# Patient Record
Sex: Female | Born: 1985 | Race: Black or African American | Hispanic: No | Marital: Married | Smoking: Never smoker
Health system: Southern US, Community
[De-identification: ages and names within clinical notes are randomized; demographics above are authoritative.]

## PROBLEM LIST (undated history)

## (undated) DIAGNOSIS — D649 Anemia, unspecified: Secondary | ICD-10-CM

## (undated) DIAGNOSIS — J45909 Unspecified asthma, uncomplicated: Secondary | ICD-10-CM

## (undated) DIAGNOSIS — R011 Cardiac murmur, unspecified: Secondary | ICD-10-CM

## (undated) HISTORY — PX: TUBAL LIGATION: SHX77

## (undated) HISTORY — PX: DILATION AND CURETTAGE OF UTERUS: SHX78

---
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
## 2006-02-27 ENCOUNTER — Emergency Department (HOSPITAL_COMMUNITY): Admission: EM | Admit: 2006-02-27 | Discharge: 2006-02-27 | Payer: Self-pay | Admitting: Emergency Medicine

## 2006-04-05 ENCOUNTER — Emergency Department (HOSPITAL_COMMUNITY): Admission: EM | Admit: 2006-04-05 | Discharge: 2006-04-05 | Payer: Self-pay | Admitting: Emergency Medicine

## 2006-04-25 ENCOUNTER — Emergency Department (HOSPITAL_COMMUNITY): Admission: EM | Admit: 2006-04-25 | Discharge: 2006-04-25 | Payer: Self-pay | Admitting: Emergency Medicine

## 2006-07-27 ENCOUNTER — Emergency Department (HOSPITAL_COMMUNITY): Admission: EM | Admit: 2006-07-27 | Discharge: 2006-07-27 | Payer: Self-pay | Admitting: Emergency Medicine

## 2006-07-29 ENCOUNTER — Emergency Department (HOSPITAL_COMMUNITY): Admission: EM | Admit: 2006-07-29 | Discharge: 2006-07-29 | Payer: Self-pay | Admitting: Emergency Medicine

## 2006-09-15 ENCOUNTER — Inpatient Hospital Stay (HOSPITAL_COMMUNITY): Admission: AD | Admit: 2006-09-15 | Discharge: 2006-09-15 | Payer: Self-pay | Admitting: Gynecology

## 2006-09-23 ENCOUNTER — Emergency Department (HOSPITAL_COMMUNITY): Admission: EM | Admit: 2006-09-23 | Discharge: 2006-09-23 | Payer: Self-pay | Admitting: Emergency Medicine

## 2006-10-19 ENCOUNTER — Inpatient Hospital Stay (HOSPITAL_COMMUNITY): Admission: AD | Admit: 2006-10-19 | Discharge: 2006-10-20 | Payer: Self-pay | Admitting: Obstetrics & Gynecology

## 2007-01-20 ENCOUNTER — Emergency Department (HOSPITAL_COMMUNITY): Admission: EM | Admit: 2007-01-20 | Discharge: 2007-01-21 | Payer: Self-pay | Admitting: Emergency Medicine

## 2007-02-17 ENCOUNTER — Ambulatory Visit (HOSPITAL_COMMUNITY): Admission: RE | Admit: 2007-02-17 | Discharge: 2007-02-17 | Payer: Self-pay | Admitting: Obstetrics

## 2007-05-28 ENCOUNTER — Emergency Department (HOSPITAL_COMMUNITY): Admission: EM | Admit: 2007-05-28 | Discharge: 2007-05-28 | Payer: Self-pay | Admitting: Emergency Medicine

## 2007-06-01 ENCOUNTER — Inpatient Hospital Stay (HOSPITAL_COMMUNITY): Admission: AD | Admit: 2007-06-01 | Discharge: 2007-06-01 | Payer: Self-pay | Admitting: Obstetrics & Gynecology

## 2007-06-13 ENCOUNTER — Inpatient Hospital Stay (HOSPITAL_COMMUNITY): Admission: AD | Admit: 2007-06-13 | Discharge: 2007-06-16 | Payer: Self-pay | Admitting: Obstetrics

## 2007-08-09 ENCOUNTER — Emergency Department (HOSPITAL_COMMUNITY): Admission: EM | Admit: 2007-08-09 | Discharge: 2007-08-10 | Payer: Self-pay | Admitting: Emergency Medicine

## 2008-01-21 ENCOUNTER — Emergency Department (HOSPITAL_COMMUNITY): Admission: EM | Admit: 2008-01-21 | Discharge: 2008-01-21 | Payer: Self-pay | Admitting: Emergency Medicine
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
## 2010-07-26 NOTE — Op Note (Signed)
Taylor Duran, Taylor Duran               ACCOUNT NO.:  [id number]   MEDICAL RECORD NO.:  [id number]          PATIENT TYPE:  MAT   LOCATION:  [facility]                          FACILITY:  [facility]   PHYSICIAN:  Phil D. Okey Dupre, M.D.     DATE OF BIRTH:  12/17/1984   DATE OF PROCEDURE:  01/10/2004  DATE OF DISCHARGE:                                 OPERATIVE REPORT   PROCEDURE:  Dilatation and evacuation of pre and postoperative diagnosis and  missed abortion.   ESTIMATED BLOOD LOSS:  50 mL.   SURGEON:  Javier Glazier. Okey Dupre, M.D.   ANESTHESIA:  MAC plus local.   PATHOLOGY:  Products of conception.   DESCRIPTION OF PROCEDURE:  Under satisfactory MAC analgesia with the patient  in dorsal lithotomy position, perineum and vagina prepped and draped in the  usual sterile manner. Bimanual pelvic examination revealed the uterus to be  5-[redacted] weeks gestation, anterior, freely moveable with normal free adnexa. A  weighted speculum was placed in the posterior fourchette of the vagina  through a marital introitus, BUS within normal limits, vagina was clean and  well rugated.  The anterior lip of a friable cervix was grasped with a  single tooth tenaculum which abruptly tore out and a double tooth tenaculum  was then used. The uterine cavity sounded to a depth of 8 cm and cervical os  dilated easily to a #10 Hegar dilator, uterine suction curette of #10 was  used to evacuate the uterine contents without any problem.  The figure-of-  eight suture was placed in the anterior base of the cervix where the  tenaculum had torn for hemostasis and the speculum removed from the vagina.  The patient transferred to the recovery room in satisfactory condition  having tolerated the procedure well.      PDR/MEDQ  D:  01/10/2004  T:  01/10/2004  Job:  [id number]

## 2010-12-03 LAB — CBC
HCT: 25.2 — ABNORMAL LOW
HCT: 25.6 — ABNORMAL LOW
Hemoglobin: 8.3 — ABNORMAL LOW
Hemoglobin: 8.6 — ABNORMAL LOW
MCHC: 32.7
MCHC: 33.5
MCV: 72.6 — ABNORMAL LOW
MCV: 74 — ABNORMAL LOW
Platelets: 209
Platelets: 221
RBC: 3.41 — ABNORMAL LOW
RBC: 3.53 — ABNORMAL LOW
RDW: 16.5 — ABNORMAL HIGH
RDW: 16.6 — ABNORMAL HIGH
WBC: 10.2
WBC: 6.9

## 2010-12-03 LAB — RPR: RPR Ser Ql: NONREACTIVE

## 2010-12-03 LAB — CCBB MATERNAL DONOR DRAW

## 2010-12-10 LAB — WET PREP, GENITAL
Trich, Wet Prep: NONE SEEN
Yeast Wet Prep HPF POC: NONE SEEN

## 2010-12-10 LAB — URINE MICROSCOPIC-ADD ON

## 2010-12-10 LAB — CBC
HCT: 31.2 — ABNORMAL LOW
Hemoglobin: 10 — ABNORMAL LOW
MCHC: 32.2
MCV: 78.1
Platelets: 235
RBC: 3.99
RDW: 17.7 — ABNORMAL HIGH
WBC: 7

## 2010-12-10 LAB — GC/CHLAMYDIA PROBE AMP, GENITAL
Chlamydia, DNA Probe: NEGATIVE
GC Probe Amp, Genital: NEGATIVE

## 2010-12-10 LAB — DIFFERENTIAL
Basophils Absolute: 0.1
Basophils Relative: 1
Eosinophils Absolute: 0.4
Eosinophils Relative: 5
Lymphocytes Relative: 28
Lymphs Abs: 2
Monocytes Absolute: 0.6
Monocytes Relative: 9
Neutro Abs: 4
Neutrophils Relative %: 57

## 2010-12-10 LAB — URINALYSIS, ROUTINE W REFLEX MICROSCOPIC
Bilirubin Urine: NEGATIVE
Glucose, UA: NEGATIVE
Ketones, ur: NEGATIVE
Nitrite: NEGATIVE
Protein, ur: NEGATIVE
Specific Gravity, Urine: 1.015
Urobilinogen, UA: 1
pH: 7

## 2010-12-10 LAB — ABO/RH: ABO/RH(D): O POS

## 2010-12-10 LAB — HCG, QUANTITATIVE, PREGNANCY: hCG, Beta Chain, Quant, S: 169103 — ABNORMAL HIGH

## 2010-12-10 LAB — PREGNANCY, URINE: Preg Test, Ur: POSITIVE

## 2010-12-23 LAB — URINALYSIS, ROUTINE W REFLEX MICROSCOPIC
Bilirubin Urine: NEGATIVE
Bilirubin Urine: NEGATIVE
Glucose, UA: NEGATIVE
Hgb urine dipstick: NEGATIVE
Ketones, ur: NEGATIVE
Nitrite: NEGATIVE
Nitrite: NEGATIVE
Specific Gravity, Urine: <1.005 — ABNORMAL LOW
Specific Gravity, Urine: 1.03
Urobilinogen, UA: 0.2
pH: 5.5
pH: 6.5

## 2010-12-23 LAB — DIFFERENTIAL
Eosinophils Absolute: 0.4
Eosinophils Relative: 8 — ABNORMAL HIGH
Lymphocytes Relative: 28
Lymphs Abs: 1.4
Monocytes Absolute: 0.5

## 2010-12-23 LAB — URINE CULTURE: Colony Count: 40000

## 2010-12-23 LAB — BASIC METABOLIC PANEL
Chloride: 108
GFR calc non Af Amer: >60
Glucose, Bld: 92
Potassium: 3.9
Sodium: 139

## 2010-12-23 LAB — POCT PREGNANCY, URINE
Operator id: 251141
Operator id: 198171
Preg Test, Ur: POSITIVE

## 2010-12-23 LAB — URINE MICROSCOPIC-ADD ON

## 2010-12-23 LAB — WET PREP, GENITAL
Clue Cells Wet Prep HPF POC: NONE SEEN
Trich, Wet Prep: NONE SEEN

## 2010-12-23 LAB — CBC
HCT: 31.5 — ABNORMAL LOW
HCT: 31.6 — ABNORMAL LOW
Hemoglobin: 10.3 — ABNORMAL LOW
MCV: 77.7 — ABNORMAL LOW
Platelets: 322
RDW: 16.5 — ABNORMAL HIGH
RDW: 16.7 — ABNORMAL HIGH
WBC: 7.4
WBC: 5

## 2010-12-23 LAB — GC/CHLAMYDIA PROBE AMP, GENITAL: Chlamydia, DNA Probe: NEGATIVE

## 2010-12-23 LAB — HCG, QUANTITATIVE, PREGNANCY: hCG, Beta Chain, Quant, S: 7354 — ABNORMAL HIGH

## 2010-12-24 LAB — URINALYSIS, ROUTINE W REFLEX MICROSCOPIC
Bilirubin Urine: NEGATIVE
Glucose, UA: NEGATIVE
Ketones, ur: NEGATIVE
pH: 6

## 2010-12-24 LAB — URINE MICROSCOPIC-ADD ON

## 2010-12-24 LAB — POCT PREGNANCY, URINE
Operator id: 23548
Preg Test, Ur: NEGATIVE

## 2011-07-17 ENCOUNTER — Emergency Department (HOSPITAL_COMMUNITY): Payer: No Typology Code available for payment source

## 2011-07-17 ENCOUNTER — Encounter (HOSPITAL_COMMUNITY): Payer: Self-pay | Admitting: Emergency Medicine

## 2011-07-17 ENCOUNTER — Emergency Department (HOSPITAL_COMMUNITY)
Admission: EM | Admit: 2011-07-17 | Discharge: 2011-07-17 | Disposition: A | Discharge status: Home / Self Care | Payer: No Typology Code available for payment source | Attending: Emergency Medicine | Admitting: Emergency Medicine

## 2011-07-17 DIAGNOSIS — J3489 Other specified disorders of nose and nasal sinuses: Secondary | ICD-10-CM | POA: Insufficient documentation

## 2011-07-17 DIAGNOSIS — M545 Low back pain, unspecified: Secondary | ICD-10-CM | POA: Insufficient documentation

## 2011-07-17 DIAGNOSIS — S335XXA Sprain of ligaments of lumbar spine, initial encounter: Secondary | ICD-10-CM | POA: Insufficient documentation

## 2011-07-17 DIAGNOSIS — R059 Cough, unspecified: Secondary | ICD-10-CM | POA: Insufficient documentation

## 2011-07-17 DIAGNOSIS — R109 Unspecified abdominal pain: Secondary | ICD-10-CM | POA: Insufficient documentation

## 2011-07-17 DIAGNOSIS — R21 Rash and other nonspecific skin eruption: Secondary | ICD-10-CM | POA: Insufficient documentation

## 2011-07-17 DIAGNOSIS — R05 Cough: Secondary | ICD-10-CM | POA: Insufficient documentation

## 2011-07-17 DIAGNOSIS — S39012A Strain of muscle, fascia and tendon of lower back, initial encounter: Secondary | ICD-10-CM

## 2011-07-17 HISTORY — DX: Cardiac murmur, unspecified: R01.1

## 2011-07-17 HISTORY — DX: Anemia, unspecified: D64.9

## 2011-07-17 LAB — URINALYSIS, ROUTINE W REFLEX MICROSCOPIC
Hgb urine dipstick: NEGATIVE
Leukocytes, UA: NEGATIVE
Nitrite: NEGATIVE
Protein, ur: NEGATIVE mg/dL
Urobilinogen, UA: 2 mg/dL — ABNORMAL HIGH (ref 0.0–1.0)

## 2011-07-17 LAB — BASIC METABOLIC PANEL
Calcium: 9.6 mg/dL (ref 8.4–10.5)
GFR calc Af Amer: >90 mL/min (ref >90)
GFR calc non Af Amer: >90 mL/min (ref >90)
Glucose, Bld: 90 mg/dL (ref 70–99)
Sodium: 136 mEq/L (ref 135–145)

## 2011-07-17 LAB — DIFFERENTIAL
Eosinophils Absolute: 0.4 10*3/uL (ref 0.0–0.7)
Eosinophils Relative: 8 % — ABNORMAL HIGH (ref 0–5)
Monocytes Absolute: 0.6 10*3/uL (ref 0.1–1.0)
Neutrophils Relative %: 39 % — ABNORMAL LOW (ref 43–77)

## 2011-07-17 LAB — CBC
MCH: 22.4 pg — ABNORMAL LOW (ref 26.0–34.0)
Platelets: 210 10*3/uL (ref 150–400)
RBC: 4.02 MIL/uL (ref 3.87–5.11)

## 2011-07-17 MED ORDER — SODIUM CHLORIDE 0.9 % IV SOLN: INTRAVENOUS | Status: DC

## 2011-07-17 MED ORDER — NAPROXEN 500 MG PO TABS: 500.0000 mg | ORAL_TABLET | Freq: Two times a day (BID) | ORAL | Status: AC

## 2011-07-17 MED ORDER — IOHEXOL 300 MG/ML  SOLN
100.0000 mL | Freq: Once | INTRAMUSCULAR | Status: AC | PRN
Start: 2011-07-17 — End: 2011-07-17
  Administered 2011-07-17: 100 mL via INTRAVENOUS

## 2011-07-17 MED ORDER — SODIUM CHLORIDE 0.9 % IV BOLUS (SEPSIS)
250.0000 mL | Freq: Once | INTRAVENOUS | Status: AC
  Administered 2011-07-17: 250 mL via INTRAVENOUS

## 2011-07-17 MED ORDER — CYCLOBENZAPRINE HCL 10 MG PO TABS: 10.0000 mg | ORAL_TABLET | Freq: Two times a day (BID) | ORAL | Status: AC | PRN

## 2011-07-17 MED ORDER — HYDROCODONE-ACETAMINOPHEN 5-325 MG PO TABS: 1.0000 | ORAL_TABLET | Freq: Four times a day (QID) | ORAL | Status: AC | PRN

## 2011-07-17 NOTE — Discharge Instructions (Signed)
CT scan of abdomen was negative also no evidence of any lumbar back problems. Take Naprosyn and Flexeril as directed take hydrocodone as needed for additional pain relief. Rest for the next 2 days. Return for any new or worse symptoms followup if back not improving in 2-4 days.
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
## 2011-12-29 ENCOUNTER — Emergency Department (HOSPITAL_COMMUNITY): Payer: Medicaid Other

## 2011-12-29 ENCOUNTER — Emergency Department (HOSPITAL_COMMUNITY)
Admission: EM | Admit: 2011-12-29 | Discharge: 2011-12-29 | Disposition: A | Discharge status: Home / Self Care | Payer: Medicaid Other | Attending: Emergency Medicine | Admitting: Emergency Medicine

## 2011-12-29 ENCOUNTER — Encounter (HOSPITAL_COMMUNITY): Payer: Self-pay | Admitting: *Deleted

## 2011-12-29 DIAGNOSIS — S8010XA Contusion of unspecified lower leg, initial encounter: Secondary | ICD-10-CM | POA: Insufficient documentation

## 2011-12-29 DIAGNOSIS — J45909 Unspecified asthma, uncomplicated: Secondary | ICD-10-CM | POA: Insufficient documentation

## 2011-12-29 DIAGNOSIS — Y939 Activity, unspecified: Secondary | ICD-10-CM | POA: Insufficient documentation

## 2011-12-29 DIAGNOSIS — Y929 Unspecified place or not applicable: Secondary | ICD-10-CM | POA: Insufficient documentation

## 2011-12-29 DIAGNOSIS — Z862 Personal history of diseases of the blood and blood-forming organs and certain disorders involving the immune mechanism: Secondary | ICD-10-CM | POA: Insufficient documentation

## 2011-12-29 DIAGNOSIS — X58XXXA Exposure to other specified factors, initial encounter: Secondary | ICD-10-CM | POA: Insufficient documentation

## 2011-12-29 DIAGNOSIS — S8000XA Contusion of unspecified knee, initial encounter: Secondary | ICD-10-CM

## 2011-12-29 HISTORY — DX: Unspecified asthma, uncomplicated: J45.909

## 2011-12-29 MED ORDER — ALBUTEROL SULFATE HFA 108 (90 BASE) MCG/ACT IN AERS: 2.0000 | INHALATION_SPRAY | RESPIRATORY_TRACT | Status: DC | PRN
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
## 2011-12-29 NOTE — ED Notes (Signed)
Patient transported to X-ray 
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
## 2011-12-29 NOTE — ED Notes (Signed)
Karen, PA in with pt 

## 2011-12-30 ENCOUNTER — Observation Stay: Payer: Self-pay | Admitting: Obstetrics and Gynecology

## 2011-12-30 LAB — URINALYSIS, COMPLETE
Glucose,UR: NEGATIVE mg/dL (ref 0–75)
Nitrite: NEGATIVE
Ph: 7 (ref 4.5–8.0)
RBC,UR: 4 /HPF (ref 0–5)
Specific Gravity: 1.005 (ref 1.003–1.030)
WBC UR: 3081 /HPF (ref 0–5)

## 2011-12-30 LAB — CBC WITH DIFFERENTIAL/PLATELET
Basophil %: 0.3 %
Eosinophil %: 0.7 %
HGB: 9.3 g/dL — ABNORMAL LOW (ref 12.0–16.0)
Lymphocyte #: 1 10*3/uL (ref 1.0–3.6)
MCH: 28.3 pg (ref 26.0–34.0)
MCV: 84 fL (ref 80–100)
Monocyte #: 0.8 x10 3/mm (ref 0.2–0.9)

## 2011-12-30 LAB — BASIC METABOLIC PANEL
BUN: 4 mg/dL — ABNORMAL LOW (ref 7–18)
Calcium, Total: 8.2 mg/dL — ABNORMAL LOW (ref 8.5–10.1)
Glucose: 76 mg/dL (ref 65–99)
Osmolality: 273 (ref 275–301)
Potassium: 3.5 mmol/L (ref 3.5–5.1)

## 2011-12-31 NOTE — ED Provider Notes (Signed)
Medical screening examination/treatment/procedure(s) were performed by non-physician practitioner and as supervising physician I was immediately available for consultation/collaboration.  John-Adam Brannan Cassedy, M.D.     John-Adam Adeleigh Barletta, MD 12/31/11 1556 

## 2013-10-26 ENCOUNTER — Encounter (HOSPITAL_COMMUNITY): Payer: Self-pay | Admitting: Emergency Medicine

## 2013-10-26 ENCOUNTER — Emergency Department (HOSPITAL_COMMUNITY)
Admission: EM | Admit: 2013-10-26 | Discharge: 2013-10-26 | Disposition: A | Discharge status: Home / Self Care | Payer: Medicaid Other | Attending: Emergency Medicine | Admitting: Emergency Medicine

## 2013-10-26 DIAGNOSIS — J45909 Unspecified asthma, uncomplicated: Secondary | ICD-10-CM | POA: Insufficient documentation

## 2013-10-26 DIAGNOSIS — R011 Cardiac murmur, unspecified: Secondary | ICD-10-CM | POA: Diagnosis not present

## 2013-10-26 DIAGNOSIS — Y9389 Activity, other specified: Secondary | ICD-10-CM | POA: Diagnosis not present

## 2013-10-26 DIAGNOSIS — Z880 Allergy status to penicillin: Secondary | ICD-10-CM | POA: Diagnosis not present

## 2013-10-26 DIAGNOSIS — Y92410 Unspecified street and highway as the place of occurrence of the external cause: Secondary | ICD-10-CM | POA: Insufficient documentation

## 2013-10-26 DIAGNOSIS — S43499A Other sprain of unspecified shoulder joint, initial encounter: Secondary | ICD-10-CM | POA: Diagnosis not present

## 2013-10-26 DIAGNOSIS — Z862 Personal history of diseases of the blood and blood-forming organs and certain disorders involving the immune mechanism: Secondary | ICD-10-CM | POA: Diagnosis not present

## 2013-10-26 DIAGNOSIS — S46819A Strain of other muscles, fascia and tendons at shoulder and upper arm level, unspecified arm, initial encounter: Secondary | ICD-10-CM | POA: Diagnosis not present

## 2013-10-26 DIAGNOSIS — S0993XA Unspecified injury of face, initial encounter: Secondary | ICD-10-CM | POA: Insufficient documentation

## 2013-10-26 DIAGNOSIS — S199XXA Unspecified injury of neck, initial encounter: Secondary | ICD-10-CM

## 2013-10-26 DIAGNOSIS — S46812A Strain of other muscles, fascia and tendons at shoulder and upper arm level, left arm, initial encounter: Secondary | ICD-10-CM

## 2013-10-26 MED ORDER — NAPROXEN 500 MG PO TABS: 500.0000 mg | ORAL_TABLET | Freq: Two times a day (BID) | ORAL | Status: DC

## 2013-10-26 MED ORDER — CYCLOBENZAPRINE HCL 10 MG PO TABS: 10.0000 mg | ORAL_TABLET | Freq: Two times a day (BID) | ORAL | Status: DC | PRN
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
## 2013-10-26 NOTE — Discharge Instructions (Signed)
naprosyn for pain. Flexeril for muscle spasms. Try heating pads and stretching. Follow up with your doctor if not improving in 3-5 days.   Cervical Sprain A cervical sprain is an injury in the neck in which the strong, fibrous tissues (ligaments) that connect your neck bones stretch or tear. Cervical sprains can range from mild to severe. Severe cervical sprains can cause the neck vertebrae to be unstable. This can lead to damage of the spinal cord and can result in serious nervous system problems. The amount of time it takes for a cervical sprain to get better depends on the cause and extent of the injury. Most cervical sprains heal in 1 to 3 weeks. CAUSES  Severe cervical sprains may be caused by:   Contact sport injuries (such as from football, rugby, wrestling, hockey, auto racing, gymnastics, diving, martial arts, or boxing).   Motor vehicle collisions.   Whiplash injuries. This is an injury from a sudden forward and backward whipping movement of the head and neck.  Falls.  Mild cervical sprains may be caused by:   Being in an awkward position, such as while cradling a telephone between your ear and shoulder.   Sitting in a chair that does not offer proper support.   Working at a poorly Marketing executivedesigned computer station.   Looking up or down for long periods of time.  SYMPTOMS   Pain, soreness, stiffness, or a burning sensation in the front, back, or sides of the neck. This discomfort may develop immediately after the injury or slowly, 24 hours or more after the injury.   Pain or tenderness directly in the middle of the back of the neck.   Shoulder or upper back pain.   Limited ability to move the neck.   Headache.   Dizziness.   Weakness, numbness, or tingling in the hands or arms.   Muscle spasms.   Difficulty swallowing or chewing.   Tenderness and swelling of the neck.  DIAGNOSIS  Most of the time your health care provider can diagnose a cervical sprain  by taking your history and doing a physical exam. Your health care provider will ask about previous neck injuries and any known neck problems, such as arthritis in the neck. X-rays may be taken to find out if there are any other problems, such as with the bones of the neck. Other tests, such as a CT scan or MRI, may also be needed.  TREATMENT  Treatment depends on the severity of the cervical sprain. Mild sprains can be treated with rest, keeping the neck in place (immobilization), and pain medicines. Severe cervical sprains are immediately immobilized. Further treatment is done to help with pain, muscle spasms, and other symptoms and may include:  Medicines, such as pain relievers, numbing medicines, or muscle relaxants.   Physical therapy. This may involve stretching exercises, strengthening exercises, and posture training. Exercises and improved posture can help stabilize the neck, strengthen muscles, and help stop symptoms from returning.  HOME CARE INSTRUCTIONS   Put ice on the injured area.   Put ice in a plastic bag.   Place a towel between your skin and the bag.   Leave the ice on for 15-20 minutes, 3-4 times a day.   If your injury was severe, you may have been given a cervical collar to wear. A cervical collar is a two-piece collar designed to keep your neck from moving while it heals.  Do not remove the collar unless instructed by your health care provider.  If you have long hair, keep it outside of the collar.  Ask your health care provider before making any adjustments to your collar. Minor adjustments may be required over time to improve comfort and reduce pressure on your chin or on the back of your head.  Ifyou are allowed to remove the collar for cleaning or bathing, follow your health care provider's instructions on how to do so safely.  Keep your collar clean by wiping it with mild soap and water and drying it completely. If the collar you have been given includes  removable pads, remove them every 1-2 days and hand wash them with soap and water. Allow them to air dry. They should be completely dry before you wear them in the collar.  If you are allowed to remove the collar for cleaning and bathing, wash and dry the skin of your neck. Check your skin for irritation or sores. If you see any, tell your health care provider.  Do not drive while wearing the collar.   Only take over-the-counter or prescription medicines for pain, discomfort, or fever as directed by your health care provider.   Keep all follow-up appointments as directed by your health care provider.   Keep all physical therapy appointments as directed by your health care provider.   Make any needed adjustments to your workstation to promote good posture.   Avoid positions and activities that make your symptoms worse.   Warm up and stretch before being active to help prevent problems.  SEEK MEDICAL CARE IF:   Your pain is not controlled with medicine.   You are unable to decrease your pain medicine over time as planned.   Your activity level is not improving as expected.  SEEK IMMEDIATE MEDICAL CARE IF:   You develop any bleeding.  You develop stomach upset.  You have signs of an allergic reaction to your medicine.   Your symptoms get worse.   You develop new, unexplained symptoms.   You have numbness, tingling, weakness, or paralysis in any part of your body.  MAKE SURE YOU:   Understand these instructions.  Will watch your condition.  Will get help right away if you are not doing well or get worse. Document Released: 12/22/2006 Document Revised: 03/01/2013 Document Reviewed: 09/01/2012 [facility] Patient Information 2015 [place], [place]. This information is not intended to replace advice given to you by your health care provider. Make sure you discuss any questions you have with your health care provider.

## 2013-10-30 NOTE — ED Provider Notes (Signed)
Medical screening examination/treatment/procedure(s) were performed by non-physician practitioner and as supervising physician I was immediately available for consultation/collaboration.   EKG Interpretation None        Candyce Churn III, MD 10/30/13 [phone]

## 2014-01-09 ENCOUNTER — Encounter (HOSPITAL_COMMUNITY): Payer: Self-pay | Admitting: Emergency Medicine

## 2014-06-13 ENCOUNTER — Ambulatory Visit: Payer: Self-pay | Admitting: Certified Nurse Midwife

## 2014-07-18 NOTE — H&P (Signed)
L&D Evaluation:  History:   HPI 29 year old G6 [id number] with EDC=05/04/2012 by a 5wk6day ultrasound presents at 28 weeks with c/o nausea/vomiting since 2200 last night. She denies diarrhea and has actually had constipation. Began feeling chills upon arrival, but denies fever. Has lower abdominal pain since 6 AM, but denies vaginal bleeding. Denies dysuria or hematuria. Has had urinary frequency since +UPT. [facility]. Prenatal course has been remarkable for threatened abortion, a UTI, and anemia. She has a history of SVD x 3.    Presents with abdominal pain, nausea/vomiting    Patient's Medical History anemia, allergies    Patient's Surgical History D&C    Medications Pre Natal Vitamins  Iron    Allergies PCN, rash and itching    Social History none    Family History Non-Contributory   ROS:   ROS see HPI   Exam:   Vital Signs afebrile. 98.3-76-18 115/68    Urine Protein urine appears turbid    General lying in bed on side with chills    Mental Status clear    Chest clear    Heart normal sinus rhythm, no murmur/gallop/rubs    Abdomen uterus NT, tenderness over suprapubic area, BS active, no upper abdominal pain    Back no CVAT    Edema no edema    FHT FHTS WNL    Ucx absent    Skin dry   Impression:   Impression IUP at 22 weeks with N/V and chills. Possible UTI. Possible AGE.   Plan:   Plan UA, IV fluids and IV antiemetics. CBC, BMP.   Electronic Signatures: Trinna BalloonGutierrez, Jaylynn Siefert L (CNM)  (Signed 22-Oct-13 17:19)  Authored: L&D Evaluation   Last Updated: 22-Oct-13 17:19 by Trinna BalloonGutierrez, Rayne Loiseau L (CNM)

## 2014-08-27 ENCOUNTER — Encounter (HOSPITAL_COMMUNITY): Payer: Self-pay | Admitting: *Deleted

## 2014-08-27 ENCOUNTER — Emergency Department (HOSPITAL_COMMUNITY)
Admission: EM | Admit: 2014-08-27 | Discharge: 2014-08-27 | Discharge status: Against Medical Advice | Payer: Medicaid Other | Attending: Emergency Medicine | Admitting: Emergency Medicine

## 2014-08-27 DIAGNOSIS — Z3202 Encounter for pregnancy test, result negative: Secondary | ICD-10-CM | POA: Insufficient documentation

## 2014-08-27 DIAGNOSIS — Z79899 Other long term (current) drug therapy: Secondary | ICD-10-CM | POA: Diagnosis not present

## 2014-08-27 DIAGNOSIS — R102 Pelvic and perineal pain: Secondary | ICD-10-CM | POA: Insufficient documentation

## 2014-08-27 DIAGNOSIS — J45909 Unspecified asthma, uncomplicated: Secondary | ICD-10-CM | POA: Insufficient documentation

## 2014-08-27 DIAGNOSIS — Z880 Allergy status to penicillin: Secondary | ICD-10-CM | POA: Diagnosis not present

## 2014-08-27 DIAGNOSIS — R011 Cardiac murmur, unspecified: Secondary | ICD-10-CM | POA: Diagnosis not present

## 2014-08-27 DIAGNOSIS — Z90710 Acquired absence of both cervix and uterus: Secondary | ICD-10-CM | POA: Insufficient documentation

## 2014-08-27 DIAGNOSIS — R1030 Lower abdominal pain, unspecified: Secondary | ICD-10-CM | POA: Diagnosis present

## 2014-08-27 DIAGNOSIS — Z862 Personal history of diseases of the blood and blood-forming organs and certain disorders involving the immune mechanism: Secondary | ICD-10-CM | POA: Insufficient documentation

## 2014-08-27 LAB — HIV ANTIBODY (ROUTINE TESTING W REFLEX): HIV SCREEN 4TH GENERATION: NONREACTIVE

## 2014-08-27 LAB — URINALYSIS, ROUTINE W REFLEX MICROSCOPIC
Bilirubin Urine: NEGATIVE
Glucose, UA: NEGATIVE mg/dL
HGB URINE DIPSTICK: NEGATIVE
KETONES UR: NEGATIVE mg/dL
LEUKOCYTES UA: NEGATIVE
NITRITE: NEGATIVE
Protein, ur: NEGATIVE mg/dL
SPECIFIC GRAVITY, URINE: 1.001 — AB (ref 1.005–1.030)
Urobilinogen, UA: 0.2 mg/dL (ref 0.0–1.0)
pH: 6 (ref 5.0–8.0)

## 2014-08-27 LAB — COMPREHENSIVE METABOLIC PANEL
ALT: 11 U/L — ABNORMAL LOW (ref 14–54)
AST: 21 U/L (ref 15–41)
Albumin: 4 g/dL (ref 3.5–5.0)
Alkaline Phosphatase: 68 U/L (ref 38–126)
Anion gap: 2 — ABNORMAL LOW (ref 5–15)
BUN: 6 mg/dL (ref 6–20)
CALCIUM: 8.6 mg/dL — AB (ref 8.9–10.3)
CHLORIDE: 109 mmol/L (ref 101–111)
CO2: 23 mmol/L (ref 22–32)
Creatinine, Ser: 0.7 mg/dL (ref 0.44–1.00)
GFR calc Af Amer: >60 mL/min (ref >60)
GFR calc non Af Amer: >60 mL/min (ref >60)
GLUCOSE: 77 mg/dL (ref 65–99)
POTASSIUM: 3.4 mmol/L — AB (ref 3.5–5.1)
Sodium: 134 mmol/L — ABNORMAL LOW (ref 135–145)
Total Bilirubin: 0.2 mg/dL — ABNORMAL LOW (ref 0.3–1.2)
Total Protein: 7.8 g/dL (ref 6.5–8.1)

## 2014-08-27 LAB — CBC WITH DIFFERENTIAL/PLATELET
BASOS ABS: 0.1 10*3/uL (ref 0.0–0.1)
Basophils Relative: 1 % (ref 0–1)
EOS ABS: 0.4 10*3/uL (ref 0.0–0.7)
Eosinophils Relative: 5 % (ref 0–5)
HEMATOCRIT: 27.9 % — AB (ref 36.0–46.0)
HEMOGLOBIN: 8 g/dL — AB (ref 12.0–15.0)
LYMPHS PCT: 32 % (ref 12–46)
Lymphs Abs: 2.3 10*3/uL (ref 0.7–4.0)
MCH: 21.2 pg — AB (ref 26.0–34.0)
MCHC: 28.7 g/dL — AB (ref 30.0–36.0)
MCV: 73.8 fL — AB (ref 78.0–100.0)
MONOS PCT: 10 % (ref 3–12)
Monocytes Absolute: 0.7 10*3/uL (ref 0.1–1.0)
NEUTROS ABS: 3.8 10*3/uL (ref 1.7–7.7)
NEUTROS PCT: 52 % (ref 43–77)
PLATELETS: 524 10*3/uL — AB (ref 150–400)
RBC: 3.78 MIL/uL — AB (ref 3.87–5.11)
RDW: 20.8 % — ABNORMAL HIGH (ref 11.5–15.5)
WBC: 7.3 10*3/uL (ref 4.0–10.5)

## 2014-08-27 LAB — RPR: RPR Ser Ql: NONREACTIVE

## 2014-08-27 LAB — WET PREP, GENITAL
Clue Cells Wet Prep HPF POC: NONE SEEN
TRICH WET PREP: NONE SEEN
Yeast Wet Prep HPF POC: NONE SEEN

## 2014-08-27 LAB — LIPASE, BLOOD: LIPASE: 20 U/L — AB (ref 22–51)

## 2014-08-27 LAB — PREGNANCY, URINE: Preg Test, Ur: NEGATIVE

## 2014-08-27 NOTE — ED Notes (Signed)
Pt reports she would like to speak with MD Littie Deeds about lab work prior to discharge. MD Littie Deeds made aware and reports he will be there shortly to speak with patient.

## 2014-08-27 NOTE — ED Notes (Signed)
Pt reports abd cramping x 2 months.  Denies n/v/d at this time. Pt reports pain is getting worse.

## 2014-08-27 NOTE — Discharge Instructions (Signed)
Abdominal Pain, Women °Abdominal (stomach, pelvic, or belly) pain can be caused by many things. It is important to tell your doctor: °· The location of the pain. °· Does it come and go or is it present all the time? °· Are there things that start the pain (eating certain foods, exercise)? °· Are there other symptoms associated with the pain (fever, nausea, vomiting, diarrhea)? °All of this is helpful to know when trying to find the cause of the pain. °CAUSES  °· Stomach: virus or bacteria infection, or ulcer. °· Intestine: appendicitis (inflamed appendix), regional ileitis (Crohn's disease), ulcerative colitis (inflamed colon), irritable bowel syndrome, diverticulitis (inflamed diverticulum of the colon), or cancer of the stomach or intestine. °· Gallbladder disease or stones in the gallbladder. °· Kidney disease, kidney stones, or infection. °· Pancreas infection or cancer. °· Fibromyalgia (pain disorder). °· Diseases of the female organs: °· Uterus: fibroid (non-cancerous) tumors or infection. °· Fallopian tubes: infection or tubal pregnancy. °· Ovary: cysts or tumors. °· Pelvic adhesions (scar tissue). °· Endometriosis (uterus lining tissue growing in the pelvis and on the pelvic organs). °· Pelvic congestion syndrome (female organs filling up with blood just before the menstrual period). °· Pain with the menstrual period. °· Pain with ovulation (producing an egg). °· Pain with an IUD (intrauterine device, birth control) in the uterus. °· Cancer of the female organs. °· Functional pain (pain not caused by a disease, may improve without treatment). °· Psychological pain. °· Depression. °DIAGNOSIS  °Your doctor will decide the seriousness of your pain by doing an examination. °· Blood tests. °· X-rays. °· Ultrasound. °· CT scan (computed tomography, special type of X-ray). °· MRI (magnetic resonance imaging). °· Cultures, for infection. °· Barium enema (dye inserted in the large intestine, to better view it with  X-rays). °· Colonoscopy (looking in intestine with a lighted tube). °· Laparoscopy (minor surgery, looking in abdomen with a lighted tube). °· Major abdominal exploratory surgery (looking in abdomen with a large incision). °TREATMENT  °The treatment will depend on the cause of the pain.  °· Many cases can be observed and treated at home. °· Over-the-counter medicines recommended by your caregiver. °· Prescription medicine. °· Antibiotics, for infection. °· Birth control pills, for painful periods or for ovulation pain. °· Hormone treatment, for endometriosis. °· Nerve blocking injections. °· Physical therapy. °· Antidepressants. °· Counseling with a psychologist or psychiatrist. °· Minor or major surgery. °HOME CARE INSTRUCTIONS  °· Do not take laxatives, unless directed by your caregiver. °· Take over-the-counter pain medicine only if ordered by your caregiver. Do not take aspirin because it can cause an upset stomach or bleeding. °· Try a clear liquid diet (broth or water) as ordered by your caregiver. Slowly move to a bland diet, as tolerated, if the pain is related to the stomach or intestine. °· Have a thermometer and take your temperature several times a day, and record it. °· Bed rest and sleep, if it helps the pain. °· Avoid sexual intercourse, if it causes pain. °· Avoid stressful situations. °· Keep your follow-up appointments and tests, as your caregiver orders. °· If the pain does not go away with medicine or surgery, you may try: °¨ Acupuncture. °¨ Relaxation exercises (yoga, meditation). °¨ Group therapy. °¨ Counseling. °SEEK MEDICAL CARE IF:  °· You notice certain foods cause stomach pain. °· Your home care treatment is not helping your pain. °· You need stronger pain medicine. °· You want your IUD removed. °· You feel faint or   lightheaded.  You develop nausea and vomiting.  You develop a rash.  You are having side effects or an allergy to your medicine. SEEK IMMEDIATE MEDICAL CARE IF:   Your  pain does not go away or gets worse.  You have a fever.  Your pain is felt only in portions of the abdomen. The right side could possibly be appendicitis. The left lower portion of the abdomen could be colitis or diverticulitis.  You are passing blood in your stools (bright red or black tarry stools, with or without vomiting).  You have blood in your urine.  You develop chills, with or without a fever.  You pass out. MAKE SURE YOU:   Understand these instructions.  Will watch your condition.  Will get help right away if you are not doing well or get worse. Document Released: 12/22/2006 Document Revised: 07/11/2013 Document Reviewed: 01/11/2009 [facility] Patient Information 2015 [place], [place]. This information is not intended to replace advice given to you by your health care provider. Make sure you discuss any questions you have with your health care provider. Ovarian Torsion The ovaries are female reproductive organs that produce eggs. Ovarian torsion is when an ovary becomes twisted and cuts off its own blood supply. This can occur at any age. If an ovary is twisted, it cannot get blood and the ovary swells. It is a painful medical emergency. It must be treated quickly. If too much time has passed, blood flow to the ovary may not be restored and the ovary may have to be removed. CAUSES Torsion can happen in an ovary that is normal size. However, most of the time it occurs in an ovary that is enlarged. An ovary can become enlarged because of:  Harmless (benign) tumors on the ovaries.  Cancerous tumors.  Ovarian cysts, which are fluid-filled sacs.  Normal pregnancy.  A pregnancy that occurs outside the uterus (ectopic pregnancy). RISK FACTORS Risk factors are things that increase the likelihood of this condition happening. The risk factors include:  Having fallopian tubes that are longer than normal.  Having ovaries that are larger than normal.  Taking fertility medicine  to become pregnant.  Having had surgery in the pelvic area. SYMPTOMS  Sudden pain in the lower abdomen, usually on one side only.  Pelvic pain that starts after exercise.  Pelvic pain that gets worse over time.  Severe pelvic pain that comes and goes.  Pelvic pain that spreads into the lower back or thigh.  Nausea and vomiting along with pelvic pain. DIAGNOSIS Your caregiver will take a history and perform a physical exam. He or she may be able to feel an enlarged ovary. Your caregiver may order some further tests, which include:  A pregnancy test.  Imaging tests, such as pelvic Doppler ultrasonography, that measures blood flow, CT scan, or MRI. Your caregiver may also perform a diagnostic laparoscopic exam. A small surgical cut (incision) will be made in your abdomen. Then, a small lighted telescope is put through the opening. This allows your caregiver to clearly see your ovary and fallopian tube.  TREATMENT Surgery is needed when an ovary becomes twisted. It is best to do this 8 hours or less after the ovary becomes twisted. Laparoscopic ovarian torsion surgery is done to try to untwist the ovary. Sometimes, a large incision has to be made in the abdomen (laparotomy) to relieve the ovary. If the ovary cannot be untwisted, the ovary will have to be surgically removed during a procedure called salpingo-oophorectomy. Document Released:  02/13/2011 Document Revised: 07/11/2013 Document Reviewed: 02/13/2011 [employer] Patient Information 2015 [place], [place]. This information is not intended to replace advice given to you by your health care provider. Make sure you discuss any questions you have with your health care provider.

## 2014-08-27 NOTE — ED Provider Notes (Addendum)
CSN: [id number]     Arrival date & time 08/27/14  0254 History   First MD Initiated Contact with Patient 08/27/14 0310     Chief Complaint  Patient presents with  . Abdominal Pain     (Consider location/radiation/quality/duration/timing/severity/associated sxs/prior Treatment) Patient is a 29 y.o. female presenting with abdominal pain.  Abdominal Pain Pain location:  Suprapubic Pain quality: cramping and sharp   Pain radiates to:  Does not radiate Pain severity:  Moderate Onset quality:  Gradual Duration:  8 weeks Timing:  Intermittent Progression:  Waxing and waning Chronicity:  New Relieved by:  Nothing Worsened by:  Nothing tried Associated symptoms: vaginal bleeding (with periods)   Associated symptoms: no anorexia, no diarrhea, no dysuria, no fever, no nausea and no vomiting     Past Medical History  Diagnosis Date  . Heart murmur   . Anemia   . Asthma   . Pregnant    Past Surgical History  Procedure Laterality Date  . Dilation and curettage of uterus     No family history on file. History  Substance Use Topics  . Smoking status: Never Smoker   . Smokeless tobacco: Not on file  . Alcohol Use: No   OB History    Gravida Para Term Preterm AB TAB SAB Ectopic Multiple Living   1              Review of Systems  Constitutional: Negative for fever.  Gastrointestinal: Positive for abdominal pain. Negative for nausea, vomiting, diarrhea and anorexia.  Genitourinary: Positive for vaginal bleeding (with periods). Negative for dysuria.  All other systems reviewed and are negative.     Allergies  Penicillins  Home Medications   Prior to Admission medications   Medication Sig Start Date End Date Taking? Authorizing Provider  albuterol (PROVENTIL HFA;VENTOLIN HFA) 108 (90 BASE) MCG/ACT inhaler Inhale 2 puffs into the lungs every 4 (four) hours as needed for wheezing. 12/29/11  Yes Lonia Skinner Sofia, PA-C  cyclobenzaprine (FLEXERIL) 10 MG tablet Take 1 tablet (10  mg total) by mouth 2 (two) times daily as needed for muscle spasms. Patient not taking: Reported on 08/27/2014 10/26/13   Tatyana Kirichenko, PA-C   BP 99/56 mmHg  Pulse 69  Temp(Src) 97.6 F (36.4 C) (Oral)  Resp 16  SpO2 98%  LMP 08/11/2014 Physical Exam  Constitutional: She is oriented to person, place, and time. She appears well-developed and well-nourished.  HENT:  Head: Normocephalic and atraumatic.  Right Ear: External ear normal.  Left Ear: External ear normal.  Eyes: Conjunctivae and EOM are normal. Pupils are equal, round, and reactive to light.  Neck: Normal range of motion. Neck supple.  Cardiovascular: Normal rate, regular rhythm, normal heart sounds and intact distal pulses.   Pulmonary/Chest: Effort normal and breath sounds normal.  Abdominal: Soft. Bowel sounds are normal. There is no tenderness.  Genitourinary: Cervix exhibits discharge (white/yellow) and friability. Cervix exhibits no motion tenderness. Right adnexum displays no tenderness and no fullness. Left adnexum displays no tenderness and no fullness. No tenderness or bleeding in the vagina.  Musculoskeletal: Normal range of motion.  Neurological: She is alert and oriented to person, place, and time.  Skin: Skin is warm and dry.  Vitals reviewed.   ED Course  Procedures (including critical care time) Labs Review Labs Reviewed  WET PREP, GENITAL - Abnormal; Notable for the following:    WBC, Wet Prep HPF POC FEW (*)    All other components within normal limits  CBC  WITH DIFFERENTIAL/PLATELET - Abnormal; Notable for the following:    RBC 3.78 (*)    Hemoglobin 8.0 (*)    HCT 27.9 (*)    MCV 73.8 (*)    MCH 21.2 (*)    MCHC 28.7 (*)    RDW 20.8 (*)    Platelets 524 (*)    All other components within normal limits  COMPREHENSIVE METABOLIC PANEL - Abnormal; Notable for the following:    Sodium 134 (*)    Potassium 3.4 (*)    Calcium 8.6 (*)    ALT 11 (*)    Total Bilirubin 0.2 (*)    Anion gap 2  (*)    All other components within normal limits  LIPASE, BLOOD - Abnormal; Notable for the following:    Lipase 20 (*)    All other components within normal limits  URINALYSIS, ROUTINE W REFLEX MICROSCOPIC (NOT AT [facility]) - Abnormal; Notable for the following:    Specific Gravity, Urine 1.001 (*)    All other components within normal limits  PREGNANCY, URINE  HIV ANTIBODY (ROUTINE TESTING)  RPR  GC/CHLAMYDIA PROBE AMP ([place]) NOT AT [facility]    Imaging Review No results found.   EKG Interpretation None      MDM   Final diagnoses:  Pelvic pain in female    29 y.o. female without pertinent PMH presents with crampy intermittent abd pain x 2 months, worsening tonight.  No systemic symptoms.  On arrival vitals and physical exam as above.  Exam concerning for possible cervicitis, and discussed that I thought abx warranted, however pt states she has no concern for STI and refused antibiotics, despite my repeated attempts to encourage empiric treatment.  Wu as above.  Also had a detailed discussion re: possibility of adnexal pathology such as ovarian torsion and encouraged close fu if she were to develop recurrent symptoms.  She is, however, asymptomatic at this time, so I do not feel [place] warranted at this time.  Wet prep negative.  DC home with standard return precautions.  I have reviewed all laboratory and imaging studies if ordered as above  1. Pelvic pain in female         Mirian Mo, MD 08/27/14 [phone]  I was called to patient's room prior to dc.  She was very upset and insisted that she was in excruciating pain throughout all of her visit, despite the fact that during my exam she claimed to only be in minimal pain.  I informed her that this could be concerning for emergent pathology such as ovarian torsion, however the pt refused further wu and was verbally abusive towards all staff.  Given that I recommended therapy which could salvage tissue or life, consider pt dc ama, as  my official advice was to obtain US, and I had had an earlier discussion specifically addressing that this was something which could cause loss of ovarian tissue.  She refused to stay for further addressing of issues.    Mirian Mo, MD 08/27/14 2423  Mirian Mo, MD 08/27/14 [phone]

## 2014-08-27 NOTE — ED Notes (Signed)
Pt alert, and oriented, and ambulatory upon DC. Patient leaving upset about lab results.

## 2014-08-28 LAB — GC/CHLAMYDIA PROBE AMP (~~LOC~~) NOT AT ARMC
CHLAMYDIA, DNA PROBE: NEGATIVE
NEISSERIA GONORRHEA: NEGATIVE

## 2014-09-08 ENCOUNTER — Encounter (HOSPITAL_COMMUNITY): Payer: Self-pay | Admitting: *Deleted

## 2014-09-08 ENCOUNTER — Inpatient Hospital Stay (HOSPITAL_COMMUNITY)
Admission: AD | Admit: 2014-09-08 | Discharge: 2014-09-08 | Disposition: A | Discharge status: Home / Self Care | Payer: Medicaid Other | Source: Ambulatory Visit | Attending: Obstetrics & Gynecology | Admitting: Obstetrics & Gynecology

## 2014-09-08 DIAGNOSIS — Z5321 Procedure and treatment not carried out due to patient leaving prior to being seen by health care provider: Secondary | ICD-10-CM | POA: Diagnosis not present

## 2014-09-08 DIAGNOSIS — R1033 Periumbilical pain: Secondary | ICD-10-CM | POA: Insufficient documentation

## 2014-09-08 DIAGNOSIS — R109 Unspecified abdominal pain: Secondary | ICD-10-CM | POA: Diagnosis present

## 2014-09-08 LAB — URINALYSIS, ROUTINE W REFLEX MICROSCOPIC
BILIRUBIN URINE: NEGATIVE
GLUCOSE, UA: NEGATIVE mg/dL
HGB URINE DIPSTICK: NEGATIVE
Ketones, ur: NEGATIVE mg/dL
LEUKOCYTES UA: NEGATIVE
Nitrite: NEGATIVE
PROTEIN: NEGATIVE mg/dL
Urobilinogen, UA: 0.2 mg/dL (ref 0.0–1.0)
pH: 6 (ref 5.0–8.0)

## 2014-09-08 LAB — POCT PREGNANCY, URINE: PREG TEST UR: NEGATIVE

## 2014-09-08 MED ORDER — KETOROLAC TROMETHAMINE 60 MG/2ML IM SOLN: 60.0000 mg | Freq: Once | INTRAMUSCULAR | Status: DC

## 2014-09-08 NOTE — MAU Provider Note (Signed)
History     CSN: [id number]  Arrival date and time: 09/08/14 0865   First Provider Initiated Contact with Patient 09/08/14 0059      No chief complaint on file.  HPI Comments: OTHELLA SLAPPEY is a 29 y.o. [phone] who presents today with abdominal pain. She states that she was seen in the ED for this same pain on 08/27/14. However, it stopped until now. She states that the pain returned about 45 min PTA. She took tylenol, but states that it did not help with the pain. She has an appointment with her PCP on 09/14/14 for evaluation of this pain. Patient is concerned because her last period was lighter than usual, and her breasts have been itchy. She states that she had a BTL, but she is worried about possible pregnancy.   Abdominal Pain This is a new problem. The current episode started more than 1 month ago. The onset quality is gradual. The problem occurs intermittently. The problem has been unchanged. The pain is located in the periumbilical region and suprapubic region. The pain is at a severity of 7/10. The quality of the pain is cramping and burning. The abdominal pain does not radiate. Associated symptoms include nausea. Pertinent negatives include no constipation, diarrhea, dysuria, fever, frequency or vomiting. Nothing aggravates the pain. The pain is relieved by nothing. She has tried acetaminophen for the symptoms. The treatment provided no relief.     Past Medical History  Diagnosis Date  . Heart murmur   . Anemia   . Asthma   . Pregnant     Past Surgical History  Procedure Laterality Date  . Dilation and curettage of uterus      No family history on file.  History  Substance Use Topics  . Smoking status: Never Smoker   . Smokeless tobacco: Not on file  . Alcohol Use: No    Allergies:  Allergies  Allergen Reactions  . Penicillins Itching and Rash    Prescriptions prior to admission  Medication Sig Dispense Refill Last Dose  . albuterol (PROVENTIL HFA;VENTOLIN  HFA) 108 (90 BASE) MCG/ACT inhaler Inhale 2 puffs into the lungs every 4 (four) hours as needed for wheezing. 1 Inhaler 2 08/26/2014 at Unknown time  . cyclobenzaprine (FLEXERIL) 10 MG tablet Take 1 tablet (10 mg total) by mouth 2 (two) times daily as needed for muscle spasms. (Patient not taking: Reported on 08/27/2014) 20 tablet 0 Completed Course at Unknown time    Review of Systems  Constitutional: Negative for fever.  Gastrointestinal: Positive for nausea and abdominal pain. Negative for vomiting, diarrhea and constipation.  Genitourinary: Negative for dysuria, urgency and frequency.   Physical Exam   Blood pressure 121/63, pulse 80, temperature 98.3 F (36.8 C), temperature source Oral, resp. rate 18, height  (1.676 m), weight 96.616 kg (213 lb), last menstrual period 08/11/2014, SpO2 100 %, unknown if currently breastfeeding.  Physical Exam  Nursing note and vitals reviewed. Constitutional: She is oriented to person, place, and time. She appears well-developed and well-nourished. No distress.  HENT:  Head: Normocephalic.  Cardiovascular: Normal rate.   Respiratory: Effort normal.  GI: Soft. She exhibits no mass. There is no tenderness. There is no rebound and no guarding.  Neurological: She is alert and oriented to person, place, and time.  Skin: Skin is warm and dry.  Psychiatric: She has a normal mood and affect.    MAU Course  Procedures  MDM 0112: Patient is requesting an X-ray of her abdomen.  Explained that an x-ray would not give us a lot of information at this time. 0124: Patient is refusing all treatment. She has left AMA at this time.   Assessment and Plan   1. Abdominal pain, periumbilical    Patient left AMA  Tawnya CrookHogan, Chesnie Capell Donovan 09/08/2014, 1:00 AM

## 2014-09-08 NOTE — MAU Note (Signed)
PT LEFT   - I EXPLAINED   LABS  AND   RESULTS  [facility]   AND  PT 'S  C/O    ABD PAIN  AND PLAN - PT  WANTED  MORE  TEST  ORDERED.

## 2014-09-08 NOTE — MAU Note (Signed)
Pt reports lower abd pain x 1 hour, denies vaginal bleeding. Denies dysuria.

## 2014-09-08 NOTE — MAU Note (Signed)
PT  SAYS   SHE HAS  ABD  PAIN-  STARTED   IN MAY  -  WENT  TO [facility] ON 6-19  - LABS , PELVIC AND  UA ,    THEN TOLD  HER  TO COME HERE  FOR  FEMALE  PROBLEMS.    PAIN HAS  CONTINUED.   TOOK   MOTRIN   600 MG  ON Tuesday   WITH  NO RELIEF.

## 2015-03-13 ENCOUNTER — Emergency Department (HOSPITAL_COMMUNITY)
Admission: EM | Admit: 2015-03-13 | Discharge: 2015-03-13 | Disposition: A | Discharge status: Home / Self Care | Payer: Medicaid Other | Attending: Emergency Medicine | Admitting: Emergency Medicine

## 2015-03-13 ENCOUNTER — Encounter (HOSPITAL_COMMUNITY): Payer: Self-pay | Admitting: Emergency Medicine

## 2015-03-13 DIAGNOSIS — R011 Cardiac murmur, unspecified: Secondary | ICD-10-CM | POA: Diagnosis not present

## 2015-03-13 DIAGNOSIS — Z880 Allergy status to penicillin: Secondary | ICD-10-CM | POA: Diagnosis not present

## 2015-03-13 DIAGNOSIS — Z79899 Other long term (current) drug therapy: Secondary | ICD-10-CM | POA: Diagnosis not present

## 2015-03-13 DIAGNOSIS — Z862 Personal history of diseases of the blood and blood-forming organs and certain disorders involving the immune mechanism: Secondary | ICD-10-CM | POA: Insufficient documentation

## 2015-03-13 DIAGNOSIS — J45909 Unspecified asthma, uncomplicated: Secondary | ICD-10-CM | POA: Diagnosis not present

## 2015-03-13 DIAGNOSIS — R05 Cough: Secondary | ICD-10-CM | POA: Diagnosis present

## 2015-03-13 DIAGNOSIS — J019 Acute sinusitis, unspecified: Secondary | ICD-10-CM | POA: Insufficient documentation

## 2015-03-13 MED ORDER — BENZONATATE 100 MG PO CAPS: 100.0000 mg | ORAL_CAPSULE | Freq: Three times a day (TID) | ORAL | Status: DC | PRN

## 2015-03-13 MED ORDER — BUTALBITAL-APAP-CAFFEINE 50-325-40 MG PO TABS: 1.0000 | ORAL_TABLET | Freq: Three times a day (TID) | ORAL | Status: AC | PRN

## 2015-03-13 MED ORDER — SALINE SPRAY 0.65 % NA SOLN
1.0000 | NASAL | Status: DC | PRN
Start: 2015-03-13 — End: 2016-08-19

## 2015-03-13 MED ORDER — DOXYCYCLINE HYCLATE 100 MG PO CAPS: 100.0000 mg | ORAL_CAPSULE | Freq: Two times a day (BID) | ORAL | Status: DC

## 2015-03-13 MED ORDER — KETOROLAC TROMETHAMINE 60 MG/2ML IM SOLN
60.0000 mg | Freq: Once | INTRAMUSCULAR | Status: AC
  Administered 2015-03-13: 60 mg via INTRAMUSCULAR
  Filled 2015-03-13: qty 2

## 2015-03-13 NOTE — ED Provider Notes (Signed)
History  By signing my name below, I, Karle PlumberJennifer Tensley, attest that this documentation has been prepared under the direction and in the presence of TRW AutomotiveKelly Sai Zinn, PA-C. Electronically Signed: Karle PlumberJennifer Tensley, ED Scribe. 03/13/2015. 10:37 PM.  Chief Complaint  Patient presents with  . Cough   The history is provided by the patient and medical records. No language interpreter was used.    HPI Comments:  Taylor Duran is a 30 y.o. obese female who presents to the Emergency Department complaining of a cough that began about two weeks ago. She reports associated HA, sinus pressure and green rhinorrhea. She states the rhinorrhea was clear but has now appeared green for the past two days. Pt states she was hoarse but that has improved. She has taken Sudafed with some relief. She has also taken Tylenol and Ibuprofen with no significant relief of the HA. She denies any sick contacts. She denies modifying factors. She denies fever, nausea, vomiting, tinnitus, photophobia or visual changes. Pt reports allergy to PCN.  Past Medical History  Diagnosis Date  . Heart murmur   . Anemia   . Asthma   . Pregnant    Past Surgical History  Procedure Laterality Date  . Dilation and curettage of uterus     History reviewed. No pertinent family history. Social History  Substance Use Topics  . Smoking status: Never Smoker   . Smokeless tobacco: None  . Alcohol Use: No   OB History    Gravida Para Term Preterm AB TAB SAB Ectopic Multiple Living   6    2 1 1   4       Review of Systems  Constitutional: Negative for fever.  HENT: Positive for congestion and sinus pressure.   Respiratory: Positive for cough.   Gastrointestinal: Negative for nausea and vomiting.  Neurological: Positive for headaches.  All other systems reviewed and are negative.   Allergies  Penicillins  Home Medications   Prior to Admission medications   Medication Sig Start Date End Date Taking? Authorizing Provider   albuterol (PROVENTIL HFA;VENTOLIN HFA) 108 (90 BASE) MCG/ACT inhaler Inhale 2 puffs into the lungs every 4 (four) hours as needed for wheezing. 12/29/11   Elson AreasLeslie K Sofia, PA-C  cyclobenzaprine (FLEXERIL) 10 MG tablet Take 1 tablet (10 mg total) by mouth 2 (two) times daily as needed for muscle spasms. Patient not taking: Reported on 08/27/2014 10/26/13   Jaynie Crumbleatyana Kirichenko, PA-C   Triage Vitals: BP 118/67 mmHg  Pulse 58  Temp(Src) 97.5 F (36.4 C) (Oral)  Resp 16  Ht 5\' 5"  (1.651 m)  Wt 220 lb (99.791 kg)  BMI 36.61 kg/m2  SpO2 98%  LMP 03/05/2015 (Approximate)  Physical Exam  Constitutional: She is oriented to person, place, and time. She appears well-developed and well-nourished. No distress.  Nontoxic/nonseptic appearing  HENT:  Head: Normocephalic and atraumatic.  Right Ear: Tympanic membrane, external ear and ear canal normal.  Left Ear: Tympanic membrane, external ear and ear canal normal.  Nose: Mucosal edema present. No rhinorrhea. No epistaxis. Right sinus exhibits maxillary sinus tenderness. Left sinus exhibits maxillary sinus tenderness.  Mouth/Throat: Uvula is midline and oropharynx is clear and moist. No oropharyngeal exudate.  Oropharynx clear. Patient tolerating secretions without difficulty.  Eyes: Conjunctivae and EOM are normal. Pupils are equal, round, and reactive to light. No scleral icterus.  Neck: Normal range of motion.  No nuchal rigidity or meningismus  Cardiovascular: Normal rate, regular rhythm and intact distal pulses.   Pulmonary/Chest: Effort normal and  breath sounds normal. No respiratory distress. She has no wheezes. She has no rales.  No accessory muscle use. Lungs clear bilaterally. No wheezing, rales, or rhonchi.  Musculoskeletal: Normal range of motion.  Neurological: She is alert and oriented to person, place, and time. No cranial nerve deficit. She exhibits normal muscle tone. Coordination normal.  GCS 15. Speech is goal oriented. No focal  neurologic deficits appreciated. Patient ambulatory with steady gait. Grip strength 5/5 bilaterally with normal strength to resistance in all major muscle groups bilaterally. Sensation to light touch intact.  Skin: Skin is warm and dry. No rash noted. She is not diaphoretic. No erythema. No pallor.  Psychiatric: She has a normal mood and affect. Her behavior is normal.  Nursing note and vitals reviewed.   ED Course  Procedures (including critical care time) DIAGNOSTIC STUDIES: Oxygen Saturation is 98% on RA, normal by my interpretation.   COORDINATION OF CARE: 10:35 PM- Will prescribe Doxycycline, Fioricet, saline nasal spray and Tessalon. Will order Toradol injection prior to discharge. Pt verbalizes understanding and agrees to plan.  Medications - No data to display   MDM   Final diagnoses:  Acute sinusitis, recurrence not specified, unspecified location    Patient complaining of symptoms of sinusitis. Severe symptoms have been present for greater than 10 days with purulent nasal discharge and maxillary sinus pain as well as scratchy throat and cough.  Concern for acute bacterial rhinosinusitis. Patient discharged with Doxycycline. Instructions given for saline nasal wash and recommendations for follow-up with primary care physician. Return precautions given at discharge. Patient agreeable to plan with no unaddressed concerns.  I personally performed the services described in this documentation, which was scribed in my presence. The recorded information has been reviewed and is accurate.    Filed Vitals:   03/13/15 2202  BP: 118/67  Pulse: 58  Temp: 97.5 F (36.4 C)  TempSrc: Oral  Resp: 16  Height: 5\' 5"  (1.651 m)  Weight: 99.791 kg  SpO2: 98%      Antony Madura, PA-C 03/13/15 2251  Vanetta Mulders, MD 03/15/15 2005

## 2015-03-13 NOTE — ED Notes (Signed)
Pt presents to the ED with c/o sinus pressure and headache x 1 week that she reports is not responsive to sudafed.

## 2015-03-13 NOTE — ED Notes (Signed)
Pt states that she has had a cough, nasal congestion, and a headachex 1 week. Alert and oriented.

## 2015-03-13 NOTE — Discharge Instructions (Signed)
Sinusitis, Adult  Sinusitis is redness, soreness, and inflammation of the paranasal sinuses. Paranasal sinuses are air pockets within the bones of your face. They are located beneath your eyes, in the middle of your forehead, and above your eyes. In healthy paranasal sinuses, mucus is able to drain out, and air is able to circulate through them by way of your nose. However, when your paranasal sinuses are inflamed, mucus and air can become trapped. This can allow bacteria and other germs to grow and cause infection.  Sinusitis can develop quickly and last only a short time (acute) or continue over a long period (chronic). Sinusitis that lasts for more than 12 weeks is considered chronic.  CAUSES  Causes of sinusitis include:  · Allergies.  · Structural abnormalities, such as displacement of the cartilage that separates your nostrils (deviated septum), which can decrease the air flow through your nose and sinuses and affect sinus drainage.  · Functional abnormalities, such as when the small hairs (cilia) that line your sinuses and help remove mucus do not work properly or are not present.  SIGNS AND SYMPTOMS  Symptoms of acute and chronic sinusitis are the same. The primary symptoms are pain and pressure around the affected sinuses. Other symptoms include:  · Upper toothache.  · Earache.  · Headache.  · Bad breath.  · Decreased sense of smell and taste.  · A cough, which worsens when you are lying flat.  · Fatigue.  · Fever.  · Thick drainage from your nose, which often is green and may contain pus (purulent).  · Swelling and warmth over the affected sinuses.  DIAGNOSIS  Your health care provider will perform a physical exam. During your exam, your health care provider may perform any of the following to help determine if you have acute sinusitis or chronic sinusitis:  · Look in your nose for signs of abnormal growths in your nostrils (nasal polyps).  · Tap over the affected sinus to check for signs of  infection.  · View the inside of your sinuses using an imaging device that has a light attached (endoscope).  If your health care provider suspects that you have chronic sinusitis, one or more of the following tests may be recommended:  · Allergy tests.  · Nasal culture. A sample of mucus is taken from your nose, sent to a lab, and screened for bacteria.  · Nasal cytology. A sample of mucus is taken from your nose and examined by your health care provider to determine if your sinusitis is related to an allergy.  TREATMENT  Most cases of acute sinusitis are related to a viral infection and will resolve on their own within 10 days. Sometimes, medicines are prescribed to help relieve symptoms of both acute and chronic sinusitis. These may include pain medicines, decongestants, nasal steroid sprays, or saline sprays.  However, for sinusitis related to a bacterial infection, your health care provider will prescribe antibiotic medicines. These are medicines that will help kill the bacteria causing the infection.  Rarely, sinusitis is caused by a fungal infection. In these cases, your health care provider will prescribe antifungal medicine.  For some cases of chronic sinusitis, surgery is needed. Generally, these are cases in which sinusitis recurs more than 3 times per year, despite other treatments.  HOME CARE INSTRUCTIONS  · Drink plenty of water. Water helps thin the mucus so your sinuses can drain more easily.  · Use a humidifier.  · Inhale steam 3-4 times a day (for   example, sit in the bathroom with the shower running).  · Apply a warm, moist washcloth to your face 3-4 times a day, or as directed by your health care provider.  · Use saline nasal sprays to help moisten and clean your sinuses.  · Take medicines only as directed by your health care provider.  · If you were prescribed either an antibiotic or antifungal medicine, finish it all even if you start to feel better.  SEEK IMMEDIATE MEDICAL CARE IF:  · You have  increasing pain or severe headaches.  · You have nausea, vomiting, or drowsiness.  · You have swelling around your face.  · You have vision problems.  · You have a stiff neck.  · You have difficulty breathing.     This information is not intended to replace advice given to you by your health care provider. Make sure you discuss any questions you have with your health care provider.     Document Released: 02/24/2005 Document Revised: 03/17/2014 Document Reviewed: 03/11/2011  Elsevier Interactive Patient Education ©2016 [employer]      Sinus Headache  A sinus headache happens when your sinuses become clogged or swollen. You may feel pain or pressure in your face, forehead, ears, or upper teeth. Sinus headaches can be mild or severe.  HOME CARE  · Take medicines only as told by your doctor.  · If you were given an antibiotic medicine, finish all of it even if you start to feel better.  · Use a nose spray if you feel stuffed up (congested).  · If told, apply a warm, moist washcloth to your face to help lessen pain.  GET HELP IF:  · You get headaches more than one time each week.  · Light or sound bothers you.  · You have a fever.  · You feel sick to your stomach (nauseous) or you throw up (vomit).  · Your headaches do not get better with treatment.  GET HELP RIGHT AWAY IF:  · You have trouble seeing.  · You suddenly have very bad pain in your face or head.  · You start to twitch or shake (seizure).  · You are confused.  · You have a stiff neck.     This information is not intended to replace advice given to you by your health care provider. Make sure you discuss any questions you have with your health care provider.     Document Released: 06/26/2010 Document Revised: 07/11/2014 Document Reviewed: 02/20/2014  Elsevier Interactive Patient Education ©2016 [employer]

## 2015-03-14 MED ORDER — AZITHROMYCIN 250 MG PO TABS: ORAL_TABLET | ORAL | Status: DC

## 2015-03-14 NOTE — ED Provider Notes (Signed)
3:35 PM Patient called ED in regards to RX: doxycycline for sinusitis. She cannot afford this. I reviewed patient's chart. She is allergic to penicillin and cannot take Augmentin. Rx for azithromycin written.   Renne CriglerJoshua Cartha Rotert, PA-C 03/14/15 1536  Azalia BilisKevin Campos, MD 03/15/15 [phone]

## 2015-05-26 ENCOUNTER — Emergency Department (HOSPITAL_COMMUNITY): Payer: No Typology Code available for payment source

## 2015-05-26 ENCOUNTER — Encounter (HOSPITAL_COMMUNITY): Payer: Self-pay | Admitting: *Deleted

## 2015-05-26 ENCOUNTER — Emergency Department (HOSPITAL_COMMUNITY)
Admission: EM | Admit: 2015-05-26 | Discharge: 2015-05-26 | Disposition: A | Discharge status: Home / Self Care | Payer: No Typology Code available for payment source | Attending: Emergency Medicine | Admitting: Emergency Medicine

## 2015-05-26 DIAGNOSIS — R6889 Other general symptoms and signs: Secondary | ICD-10-CM

## 2015-05-26 DIAGNOSIS — J029 Acute pharyngitis, unspecified: Secondary | ICD-10-CM | POA: Insufficient documentation

## 2015-05-26 DIAGNOSIS — Z792 Long term (current) use of antibiotics: Secondary | ICD-10-CM | POA: Insufficient documentation

## 2015-05-26 DIAGNOSIS — Z7952 Long term (current) use of systemic steroids: Secondary | ICD-10-CM | POA: Insufficient documentation

## 2015-05-26 DIAGNOSIS — Z79899 Other long term (current) drug therapy: Secondary | ICD-10-CM | POA: Insufficient documentation

## 2015-05-26 DIAGNOSIS — J45909 Unspecified asthma, uncomplicated: Secondary | ICD-10-CM | POA: Insufficient documentation

## 2015-05-26 DIAGNOSIS — J309 Allergic rhinitis, unspecified: Secondary | ICD-10-CM

## 2015-05-26 DIAGNOSIS — R011 Cardiac murmur, unspecified: Secondary | ICD-10-CM | POA: Insufficient documentation

## 2015-05-26 DIAGNOSIS — M791 Myalgia: Secondary | ICD-10-CM | POA: Insufficient documentation

## 2015-05-26 DIAGNOSIS — Z862 Personal history of diseases of the blood and blood-forming organs and certain disorders involving the immune mechanism: Secondary | ICD-10-CM | POA: Insufficient documentation

## 2015-05-26 DIAGNOSIS — Z20818 Contact with and (suspected) exposure to other bacterial communicable diseases: Secondary | ICD-10-CM

## 2015-05-26 MED ORDER — CETIRIZINE HCL 10 MG PO TABS: 10.0000 mg | ORAL_TABLET | Freq: Every day | ORAL | Status: DC

## 2015-05-26 MED ORDER — IPRATROPIUM-ALBUTEROL 0.5-2.5 (3) MG/3ML IN SOLN
3.0000 mL | Freq: Once | RESPIRATORY_TRACT | Status: AC
  Administered 2015-05-26: 3 mL via RESPIRATORY_TRACT
  Filled 2015-05-26: qty 3

## 2015-05-26 MED ORDER — FLUTICASONE PROPIONATE 50 MCG/ACT NA SUSP: 2.0000 | Freq: Every day | NASAL | Status: DC

## 2015-05-26 MED ORDER — CEPHALEXIN 500 MG PO CAPS: 500.0000 mg | ORAL_CAPSULE | Freq: Two times a day (BID) | ORAL | Status: AC

## 2015-05-26 MED ORDER — CEPHALEXIN 250 MG PO CAPS
250.0000 mg | ORAL_CAPSULE | Freq: Once | ORAL | Status: AC
  Administered 2015-05-26: 250 mg via ORAL
  Filled 2015-05-26: qty 1

## 2015-05-26 NOTE — ED Notes (Signed)
Pt states "when I woke up this morning I just hurt all over, have a h/a, congested."  Pt denies n/v/d.

## 2015-05-26 NOTE — Discharge Instructions (Signed)
Strep Throat °Strep throat is a bacterial infection of the throat. Your health care provider may call the infection tonsillitis or pharyngitis, depending on whether there is swelling in the tonsils or at the back of the throat. Strep throat is most common during the cold months of the year in children who are 5-29 years of age, but it can happen during any season in people of any age. This infection is spread from person to person (contagious) through coughing, sneezing, or close contact. °CAUSES °Strep throat is caused by the bacteria called Streptococcus pyogenes. °RISK FACTORS °This condition is more likely to develop in: °· People who spend time in crowded places where the infection can spread easily. °· People who have close contact with someone who has strep throat. °SYMPTOMS °Symptoms of this condition include: °· Fever or chills.   °· Redness, swelling, or pain in the tonsils or throat. °· Pain or difficulty when swallowing. °· White or yellow spots on the tonsils or throat. °· Swollen, tender glands in the neck or under the jaw. °· Red rash all over the body (rare). °DIAGNOSIS °This condition is diagnosed by performing a rapid strep test or by taking a swab of your throat (throat culture test). Results from a rapid strep test are usually ready in a few minutes, but throat culture test results are available after one or two days. °TREATMENT °This condition is treated with antibiotic medicine. °HOME CARE INSTRUCTIONS °Medicines °· Take over-the-counter and prescription medicines only as told by your health care provider. °· Take your antibiotic as told by your health care provider. Do not stop taking the antibiotic even if you start to feel better. °· Have family members who also have a sore throat or fever tested for strep throat. They may need antibiotics if they have the strep infection. °Eating and Drinking °· Do not share food, drinking cups, or personal items that could cause the infection to spread to  other people. °· If swallowing is difficult, try eating soft foods until your sore throat feels better. °· Drink enough fluid to keep your urine clear or pale yellow. °General Instructions °· Gargle with a salt-water mixture 3-4 times per day or as needed. To make a salt-water mixture, completely dissolve ½-1 tsp of salt in 1 cup of warm water. °· Make sure that all household members wash their hands well. °· Get plenty of rest. °· Stay home from school or work until you have been taking antibiotics for 24 hours. °· Keep all follow-up visits as told by your health care provider. This is important. °SEEK MEDICAL CARE IF: °· The glands in your neck continue to get bigger. °· You develop a rash, cough, or earache. °· You cough up a thick liquid that is green, yellow-brown, or bloody. °· You have pain or discomfort that does not get better with medicine. °· Your problems seem to be getting worse rather than better. °· You have a fever. °SEEK IMMEDIATE MEDICAL CARE IF: °· You have new symptoms, such as vomiting, severe headache, stiff or painful neck, chest pain, or shortness of breath. °· You have severe throat pain, drooling, or changes in your voice. °· You have swelling of the neck, or the skin on the neck becomes red and tender. °· You have signs of dehydration, such as fatigue, dry mouth, and decreased urination. °· You become increasingly sleepy, or you cannot wake up completely. °· Your joints become red or painful. °  °This information is not intended to replace   advice given to you by your health care provider. Make sure you discuss any questions you have with your health care provider.   Document Released: 02/22/2000 Document Revised: 11/15/2014 Document Reviewed: 06/19/2014 Elsevier Interactive Patient Education 2016 [employer]   Allergic Rhinitis Allergic rhinitis is when the mucous membranes in the nose respond to allergens. Allergens are particles in the air that cause your body to have an  allergic reaction. This causes you to release allergic antibodies. Through a chain of events, these eventually cause you to release histamine into the blood stream. Although meant to protect the body, it is this release of histamine that causes your discomfort, such as frequent sneezing, congestion, and an itchy, runny nose.  CAUSES Seasonal allergic rhinitis (hay fever) is caused by pollen allergens that may come from grasses, trees, and weeds. Year-round allergic rhinitis (perennial allergic rhinitis) is caused by allergens such as house dust mites, pet dander, and mold spores. SYMPTOMS  Nasal stuffiness (congestion).  Itchy, runny nose with sneezing and tearing of the eyes. DIAGNOSIS Your health care provider can help you determine the allergen or allergens that trigger your symptoms. If you and your health care provider are unable to determine the allergen, skin or blood testing may be used. Your health care provider will diagnose your condition after taking your health history and performing a physical exam. Your health care provider may assess you for other related conditions, such as asthma, pink eye, or an ear infection. TREATMENT Allergic rhinitis does not have a cure, but it can be controlled by:  Medicines that block allergy symptoms. These may include allergy shots, nasal sprays, and oral antihistamines.  Avoiding the allergen. Hay fever may often be treated with antihistamines in pill or nasal spray forms. Antihistamines block the effects of histamine. There are over-the-counter medicines that may help with nasal congestion and swelling around the eyes. Check with your health care provider before taking or giving this medicine. If avoiding the allergen or the medicine prescribed do not work, there are many new medicines your health care provider can prescribe. Stronger medicine may be used if initial measures are ineffective. Desensitizing injections can be used if medicine and  avoidance does not work. Desensitization is when a patient is given ongoing shots until the body becomes less sensitive to the allergen. Make sure you follow up with your health care provider if problems continue. HOME CARE INSTRUCTIONS It is not possible to completely avoid allergens, but you can reduce your symptoms by taking steps to limit your exposure to them. It helps to know exactly what you are allergic to so that you can avoid your specific triggers. SEEK MEDICAL CARE IF:  You have a fever.  You develop a cough that does not stop easily (persistent).  You have shortness of breath.  You start wheezing.  Symptoms interfere with normal daily activities.   This information is not intended to replace advice given to you by your health care provider. Make sure you discuss any questions you have with your health care provider.   Document Released: 11/19/2000 Document Revised: 03/17/2014 Document Reviewed: 11/01/2012 Elsevier Interactive Patient Education 2016 [employer] Inc[employer]  Influenza Tests WHY AM I HAVING THIS TEST? You may have an influenza test to help your health care provider determine what type of respiratory infection you have. The test may also be used to help determine a treatment plan and to monitor influenza activity within a community. There are two types of influenza virus: types A and B.  Often, one strain of type A influenza will be the most common type of influenza in a community during flu season. This is typically between the months of October and May. Influenza tests can help determine which strain of influenza type A is occurring most often in the community. WHAT KIND OF SAMPLE IS TAKEN? Influenza tests are performed by collecting a small sample of fluids (secretions) from your nose or throat using a cotton swab. Tests performed on nasal secretions are more accurate than tests performed on a sample taken from your throat.  Rapid influenza tests are available and have  become the most frequently used tests for influenza. They are most accurate when completed within the first 48 hours after your symptoms begin.  Depending on the method, a rapid influenza test may be completed in your health care provider's office in less than 30 minutes. It can also be sent to a lab with the results available the same day.  Depending on the particular type of test used, it can identify influenza type A, a mixture of types A and B, or differentiate between type A and B.  Another test that your health care provider may order is a viral culture. This also requires the collection of secretions from your nose or throat. The sample is then sent to a lab for processing. This may take several days to complete. HOW ARE YOUR TEST RESULTS REPORTED? Your test results will be reported as either positive or negative. A false-negative result can occur. A false-negative result is incorrect because it indicates a condition or finding is not present when it is. It is your responsibility to obtain your test results. Ask the lab or department performing the test when and how you will get your results. WHAT DO THE RESULTS MEAN?  A positive test means you have influenza. Tests may further determine the type of influenza you have.  A negative influenza test result means it is not likely that you have influenza.  A false-negative result can occur. False-negative results are more likely to happen at the height of the influenza season. Talk with your health care provider to discuss your results, treatment options, and if necessary, the need for more tests. Talk with your health care provider if you have any questions about your results.   This information is not intended to replace advice given to you by your health care provider. Make sure you discuss any questions you have with your health care provider.   Document Released: 12/04/2004 Document Revised: 03/17/2014 Document Reviewed:  07/13/2013 Elsevier Interactive Patient Education [employer]

## 2015-05-26 NOTE — ED Provider Notes (Signed)
CSN: [id number]     Arrival date & time 05/26/15  1631 History  By signing my name below, I, Sonum Patel, attest that this documentation has been prepared under the direction and in the presence of Keyonta Barradas PA-C. Electronically Signed: Sonum Patel, Scribe. 05/26/2015. 5:37 PM.    Chief Complaint  Patient presents with  . Generalized Body Aches  . Nasal Congestion    The history is provided by the patient. No language interpreter was used.     HPI Comments: Taylor Duran is a 30 y.o. female who presents to the Emergency Department complaining of 1 day of generalized myalgias with associated rhinorrhea, congestion, subjective fever and chills, and chest tightness. She has taken ibuprofen without relief. She reports being around family members with bronchitis. She denies wheezing, abdominal pain, vomiting, cough, N, V, rash.  Past Medical History  Diagnosis Date  . Heart murmur   . Anemia   . Asthma   . Pregnant    Past Surgical History  Procedure Laterality Date  . Dilation and curettage of uterus     No family history on file. Social History  Substance Use Topics  . Smoking status: Never Smoker   . Smokeless tobacco: None  . Alcohol Use: No   OB History    Gravida Para Term Preterm AB TAB SAB Ectopic Multiple Living   Review of Systems  Constitutional: Positive for fever (subjective) and chills. Negative for activity change and appetite change.  HENT: Positive for congestion and rhinorrhea. Negative for sore throat.   Respiratory: Negative for cough, chest tightness, shortness of breath and wheezing.   Cardiovascular: Negative for chest pain, palpitations and leg swelling.  Gastrointestinal: Negative.  Negative for vomiting and abdominal pain.  Musculoskeletal: Positive for myalgias. Negative for neck pain and neck stiffness.  Skin: Negative.   Neurological: Negative.   All other systems reviewed and are negative.     Allergies   Penicillins  Home Medications   Prior to Admission medications   Medication Sig Start Date End Date Taking? Authorizing Provider  albuterol (PROVENTIL HFA;VENTOLIN HFA) 108 (90 BASE) MCG/ACT inhaler Inhale 2 puffs into the lungs every 4 (four) hours as needed for wheezing. 12/29/11   Elson Areas, PA-C  azithromycin (ZITHROMAX Z-PAK) 250 MG tablet Take 2 tabs PO on day 1, 1 tab PO on days 2-5 03/14/15   Renne Crigler, PA-C  benzonatate (TESSALON) 100 MG capsule Take 1 capsule (100 mg total) by mouth 3 (three) times daily as needed for cough. 03/13/15   Antony Madura, PA-C  butalbital-acetaminophen-caffeine (FIORICET) [phone] MG tablet Take 1-2 tablets by mouth every 8 (eight) hours as needed for headache. 03/13/15 03/12/16  Antony Madura, PA-C  cephALEXin (KEFLEX) 500 MG capsule Take 1 capsule (500 mg total) by mouth 2 (two) times daily. 05/26/15 06/05/15  Danelle Berry, PA-C  cetirizine (ZYRTEC ALLERGY) 10 MG tablet Take 1 tablet (10 mg total) by mouth daily. 05/26/15   Danelle Berry, PA-C  cyclobenzaprine (FLEXERIL) 10 MG tablet Take 1 tablet (10 mg total) by mouth 2 (two) times daily as needed for muscle spasms. Patient not taking: Reported on 08/27/2014 10/26/13   Tatyana Kirichenko, PA-C  doxycycline (VIBRAMYCIN) 100 MG capsule Take 1 capsule (100 mg total) by mouth 2 (two) times daily. 03/13/15   Antony Madura, PA-C  fluticasone (FLONASE) 50 MCG/ACT nasal spray Place 2 sprays into both nostrils daily. 05/26/15  Danelle Berry, PA-C  sodium chloride (OCEAN) 0.65 % SOLN nasal spray Place 1 spray into both nostrils as needed for congestion. 03/13/15   Antony Madura, PA-C   BP 116/60 mmHg  Pulse 93  Temp(Src) 98.3 F (36.8 C) (Oral)  Resp 16  SpO2 100%  LMP 05/09/2015 Physical Exam  Constitutional: She is oriented to person, place, and time. She appears well-developed and well-nourished. No distress.  HENT:  Head: Normocephalic and atraumatic.  Nose: Nose normal.  Mouth/Throat: Oropharynx is clear and moist.  No oropharyngeal exudate.  Eyes: Conjunctivae and EOM are normal. Pupils are equal, round, and reactive to light. Right eye exhibits no discharge. Left eye exhibits no discharge. No scleral icterus.  Edematous pale nasal mucosa  Neck: Normal range of motion. Neck supple. No JVD present. No tracheal deviation present. No thyromegaly present.  Cardiovascular: Normal rate, regular rhythm, normal heart sounds and intact distal pulses.  Exam reveals no gallop and no friction rub.   No murmur heard. Pulmonary/Chest: Effort normal and breath sounds normal. No respiratory distress. She has no wheezes. She has no rales. She exhibits tenderness.  Abdominal: Soft. Bowel sounds are normal. She exhibits no distension and no mass. There is no tenderness. There is no rebound and no guarding.  Musculoskeletal: Normal range of motion. She exhibits no edema or tenderness.  Lymphadenopathy:    She has no cervical adenopathy.  Neurological: She is alert and oriented to person, place, and time. She has normal reflexes. No cranial nerve deficit. She exhibits normal muscle tone. Coordination normal.  Skin: Skin is warm and dry. No rash noted. She is not diaphoretic. No erythema. No pallor.  Psychiatric: She has a normal mood and affect. Her behavior is normal. Judgment and thought content normal.  Nursing note and vitals reviewed.   ED Course  Procedures (including critical care time)  DIAGNOSTIC STUDIES: Oxygen Saturation is 99% on RA, normal by my interpretation.    COORDINATION OF CARE: 10:46 PM Discussed treatment plan with pt at bedside and pt agreed to plan.   Labs Review Labs Reviewed - No data to display  Imaging Review  DG Chest 2 View (Final result) Result time: 05/26/15 18:26:54   Final result by Rad Results In Interface (05/26/15 18:26:54)   Narrative:   CLINICAL DATA: Fever and chills  EXAM: CHEST 2 VIEW  COMPARISON: None.  FINDINGS: The heart size and mediastinal contours are  within normal limits. Both lungs are clear. The visualized skeletal structures are unremarkable.  IMPRESSION: No active cardiopulmonary disease.   Electronically Signed By: Alcide Clever M.D. On: 05/26/2015 18:26     I have personally reviewed and evaluated these images and lab results as part of my medical decision-making.   EKG Interpretation None      MDM   Pt presents with fever, body aches, rhinorrhea and intermittent cough.  Consistent with flu-like illness, VSS, no respiratory distress.  CXR negative.  Nasal tissue consistent with allergic rhinitis. The pt presented with her daughter who tested positive for strep throat.  Will tx pt as she may also have strep with fever/body aches.  She has penicillin allergy.  She asked for the cheapest medicine that will cover - will give Keflex.  Final diagnoses:  Flu-like symptoms  Exposure to strep throat  Allergic rhinitis, unspecified allergic rhinitis type   I personally performed the services described in this documentation, which was scribed in my presence. The recorded information has been reviewed and is accurate.  Danelle BerryLeisa Mikail Goostree, PA-C 06/04/15 0141  Tilden FossaElizabeth Rees, MD 06/06/15 [phone]

## 2016-03-05 ENCOUNTER — Encounter (HOSPITAL_COMMUNITY): Payer: Self-pay | Admitting: Emergency Medicine

## 2016-03-05 ENCOUNTER — Emergency Department (HOSPITAL_COMMUNITY)
Admission: EM | Admit: 2016-03-05 | Discharge: 2016-03-06 | Disposition: A | Discharge status: Home / Self Care | Payer: Medicaid Other | Attending: Emergency Medicine | Admitting: Emergency Medicine

## 2016-03-05 DIAGNOSIS — J45909 Unspecified asthma, uncomplicated: Secondary | ICD-10-CM | POA: Insufficient documentation

## 2016-03-05 DIAGNOSIS — R42 Dizziness and giddiness: Secondary | ICD-10-CM

## 2016-03-05 DIAGNOSIS — N390 Urinary tract infection, site not specified: Secondary | ICD-10-CM

## 2016-03-05 LAB — URINALYSIS, ROUTINE W REFLEX MICROSCOPIC
BILIRUBIN URINE: NEGATIVE
Glucose, UA: NEGATIVE mg/dL
Ketones, ur: NEGATIVE mg/dL
NITRITE: NEGATIVE
PROTEIN: NEGATIVE mg/dL
Specific Gravity, Urine: 1.005 (ref 1.005–1.030)
pH: 6 (ref 5.0–8.0)

## 2016-03-05 LAB — BASIC METABOLIC PANEL
ANION GAP: 9 (ref 5–15)
BUN: 6 mg/dL (ref 6–20)
CO2: 27 mmol/L (ref 22–32)
Calcium: 9 mg/dL (ref 8.9–10.3)
Chloride: 104 mmol/L (ref 101–111)
Creatinine, Ser: 0.62 mg/dL (ref 0.44–1.00)
GFR calc Af Amer: >60 mL/min (ref >60)
GLUCOSE: 98 mg/dL (ref 65–99)
POTASSIUM: 3.3 mmol/L — AB (ref 3.5–5.1)
SODIUM: 140 mmol/L (ref 135–145)

## 2016-03-05 LAB — CBC
HEMATOCRIT: 29.4 % — AB (ref 36.0–46.0)
HEMOGLOBIN: 8.9 g/dL — AB (ref 12.0–15.0)
MCH: 21.8 pg — ABNORMAL LOW (ref 26.0–34.0)
MCHC: 30.3 g/dL (ref 30.0–36.0)
MCV: 72.1 fL — ABNORMAL LOW (ref 78.0–100.0)
Platelets: 399 10*3/uL (ref 150–400)
RBC: 4.08 MIL/uL (ref 3.87–5.11)
RDW: 16.8 % — ABNORMAL HIGH (ref 11.5–15.5)
WBC: 7.3 10*3/uL (ref 4.0–10.5)

## 2016-03-05 LAB — CBG MONITORING, ED: Glucose-Capillary: 90 mg/dL (ref 65–99)

## 2016-03-05 LAB — POC URINE PREG, ED: Preg Test, Ur: NEGATIVE

## 2016-03-05 NOTE — ED Notes (Signed)
Pt placed on monitor.  

## 2016-03-05 NOTE — ED Triage Notes (Addendum)
Pt from home with complaints of dizziness and emesis x 5 days. Pt's last menstrual cycle was Nov 15th. Pt states she has had intermittent headaches. Pt  Denies other changes   Pt ambulates without difficulty and with a steady gait

## 2016-03-05 NOTE — ED Notes (Signed)
Pt stated "I started feeling dizzy on Saturday.  I started to stand up and got dizzy.  I vomited 1 time on Sunday.  I still feel the dizziness & lightheadedness.  I didn't know if it was my low iron."

## 2016-03-05 NOTE — ED Notes (Signed)
Pt stated "I haven't been to my doctor in a year or 2."

## 2016-03-06 MED ORDER — MECLIZINE HCL 25 MG PO TABS: 25.0000 mg | ORAL_TABLET | Freq: Three times a day (TID) | ORAL | 0 refills | Status: DC | PRN

## 2016-03-06 MED ORDER — NITROFURANTOIN MONOHYD MACRO 100 MG PO CAPS: 100.0000 mg | ORAL_CAPSULE | Freq: Two times a day (BID) | ORAL | 0 refills | Status: DC

## 2016-03-06 MED ORDER — NITROFURANTOIN MONOHYD MACRO 100 MG PO CAPS
100.0000 mg | ORAL_CAPSULE | Freq: Once | ORAL | Status: AC
  Administered 2016-03-06: 100 mg via ORAL
  Filled 2016-03-06: qty 1

## 2016-03-06 NOTE — ED Provider Notes (Signed)
[facility] Provider Note   CSN: [id number] Arrival date & time: 03/05/16  1726  By signing my name below, I, Javier Dockerobert Ryan Halas, attest that this documentation has been prepared under the direction and in the presence of Pricilla LovelessScott Coreen Shippee, MD. Electronically Signed: Javier Dockerobert Ryan Halas, ER Scribe. 10/20/2015. 12:56 AM.  History   Chief Complaint Chief Complaint  Patient presents with  . Dizziness  . Emesis   HPI  HPI Comments: Taylor Duran is a 30 y.o. female who presents to the Emergency Department complaining of five days of intermittent room spinning with one event of associated vomiting. Her sx occur with bending over or standing from sitting. She has intermittent HA, increased from baseline but her HA are not associated with her room spinning sx. She denies fever, chills, palpitations, diarrhea, flank pain, neck pain, stiffness, ear pain or ear ringing. She is also complaining of increased urine odor and urinary frequency.     Past Medical History:  Diagnosis Date  . Anemia   . Asthma   . Heart murmur   . Pregnant     There are no active problems to display for this patient.   Past Surgical History:  Procedure Laterality Date  . DILATION AND CURETTAGE OF UTERUS      OB History    Gravida Para Term Preterm AB Living   6       2 4    SAB TAB Ectopic Multiple Live Births   1 1             Home Medications    Prior to Admission medications   Medication Sig Start Date End Date Taking? Authorizing Provider  albuterol (PROVENTIL HFA;VENTOLIN HFA) 108 (90 BASE) MCG/ACT inhaler Inhale 2 puffs into the lungs every 4 (four) hours as needed for wheezing. 12/29/11   Elson AreasLeslie K Sofia, PA-C  azithromycin (ZITHROMAX Z-PAK) 250 MG tablet Take 2 tabs PO on day 1, 1 tab PO on days 2-5 03/14/15   Renne CriglerJoshua Geiple, PA-C  benzonatate (TESSALON) 100 MG capsule Take 1 capsule (100 mg total) by mouth 3 (three) times daily as needed for cough. 03/13/15   Antony MaduraKelly Humes, PA-C    butalbital-acetaminophen-caffeine (FIORICET) [phone] MG tablet Take 1-2 tablets by mouth every 8 (eight) hours as needed for headache. 03/13/15 03/12/16  Antony MaduraKelly Humes, PA-C  cetirizine (ZYRTEC ALLERGY) 10 MG tablet Take 1 tablet (10 mg total) by mouth daily. 05/26/15   Danelle BerryLeisa Tapia, PA-C  cyclobenzaprine (FLEXERIL) 10 MG tablet Take 1 tablet (10 mg total) by mouth 2 (two) times daily as needed for muscle spasms. Patient not taking: Reported on 08/27/2014 10/26/13   Tatyana Kirichenko, PA-C  doxycycline (VIBRAMYCIN) 100 MG capsule Take 1 capsule (100 mg total) by mouth 2 (two) times daily. 03/13/15   Antony MaduraKelly Humes, PA-C  fluticasone (FLONASE) 50 MCG/ACT nasal spray Place 2 sprays into both nostrils daily. 05/26/15   Danelle BerryLeisa Tapia, PA-C  meclizine (ANTIVERT) 25 MG tablet Take 1 tablet (25 mg total) by mouth 3 (three) times daily as needed for dizziness. 03/06/16   Pricilla LovelessScott Murrell Dome, MD  nitrofurantoin, macrocrystal-monohydrate, (MACROBID) 100 MG capsule Take 1 capsule (100 mg total) by mouth 2 (two) times daily. 03/06/16   Pricilla LovelessScott Angenette Daily, MD  sodium chloride (OCEAN) 0.65 % SOLN nasal spray Place 1 spray into both nostrils as needed for congestion. 03/13/15   Antony MaduraKelly Humes, PA-C    Family History No family history on file.  Social History Social History  Substance Use Topics  . Smoking  status: Never Smoker  . Smokeless tobacco: Not on file  . Alcohol use No     Allergies   Penicillins   Review of Systems Review of Systems  Constitutional: Negative for chills and fever.  Neurological: Positive for dizziness and headaches.  All other systems reviewed and are negative.    Physical Exam Updated Vital Signs BP 107/69 (BP Location: Left Arm)   Pulse 74   Temp 97.9 F (36.6 C) (Oral)   Resp 18   Ht 5\' 6"  (1.676 m)   Wt 230 lb (104.3 kg)   SpO2 100%   BMI 37.12 kg/m   Physical Exam  Constitutional: She is oriented to person, place, and time. She appears well-developed and well-nourished. No  distress.  HENT:  Head: Normocephalic and atraumatic.  Right Ear: Tympanic membrane and external ear normal.  Left Ear: Tympanic membrane and external ear normal.  Nose: Nose normal.  Eyes: EOM are normal. Pupils are equal, round, and reactive to light. Right eye exhibits no discharge. Left eye exhibits no discharge.  Neck: Normal range of motion. Neck supple.  Cardiovascular: Normal rate, regular rhythm and normal heart sounds.   Pulmonary/Chest: Effort normal and breath sounds normal. No respiratory distress.  Abdominal: Soft. There is no tenderness.  No CVA tenderness.  Musculoskeletal: Normal range of motion.  Neurological: She is alert and oriented to person, place, and time. Coordination normal.  Cranial nerves 3-12 grossly intact. 5/5 strength in all extremities. Grossly normal sensation. Normal finger to nose.  Skin: Skin is warm and dry. She is not diaphoretic.  Psychiatric: She has a normal mood and affect. Her behavior is normal.  Nursing note and vitals reviewed.    ED Treatments / Results  DIAGNOSTIC STUDIES: Oxygen Saturation is 100% on RA, normal by my interpretation.    COORDINATION OF CARE: 1:04 AM Discussed treatment plan with pt at bedside and pt agreed to plan.  Labs (all labs ordered are listed, but only abnormal results are displayed) Labs Reviewed  BASIC METABOLIC PANEL - Abnormal; Notable for the following:       Result Value   Potassium 3.3 (*)    All other components within normal limits  CBC - Abnormal; Notable for the following:    Hemoglobin 8.9 (*)    HCT 29.4 (*)    MCV 72.1 (*)    MCH 21.8 (*)    RDW 16.8 (*)    All other components within normal limits  URINALYSIS, ROUTINE W REFLEX MICROSCOPIC - Abnormal; Notable for the following:    APPearance HAZY (*)    Hgb urine dipstick SMALL (*)    Leukocytes, UA LARGE (*)    Bacteria, UA MANY (*)    Squamous Epithelial / LPF 6-30 (*)    All other components within normal limits  URINE CULTURE    CBG MONITORING, ED  POC URINE PREG, ED    EKG  EKG Interpretation  Date/Time:  Thursday March 06 2016 01:14:34 EST Ventricular Rate:  70 PR Interval:    QRS Duration: 117 QT Interval:  389 QTC Calculation: 420 R Axis:   48 Text Interpretation:  Normal sinus rhythm Nonspecific intraventricular conduction delay Low voltage, precordial leads No old tracing to compare Confirmed by Avaree Gilberti MD, Hardin Hardenbrook [id number]) on 03/06/2016 1:36:54 AM       Radiology No results found.  Procedures Procedures (including critical care time)  Medications Ordered in ED Medications  nitrofurantoin (macrocrystal-monohydrate) (MACROBID) capsule 100 mg (not administered)  Initial Impression / Assessment and Plan / ED Course  I have reviewed the triage vital signs and the nursing notes.  Pertinent labs & imaging results that were available during my care of the patient were reviewed by me and considered in my medical decision making (see chart for details).  Clinical Course as of Mar 06 149  Thu Mar 06, 2016  0100 EKG, macrobid for UTI. Probably vertigo based on symptoms, not present now. Neuro exam benign. No signs of more severe disease such as sepsis or pyelo.   [SG]    Clinical Course User Index [SG] Pricilla Loveless, MD    Patient will be given Antivert for what is likely peripheral vertigo. I highly doubt a central cause. She has had progressive intermittent headaches for months, discussed possible CT scanning but she declines. He would likely be negative and MRI would be more sensitive but is not emergently needed. Discussed following up with PCP for further workup. Highly doubt space-occupying mass. Will give Macrobid for UTI. Discussed return precautions and need for follow-up.  Final Clinical Impressions(s) / ED Diagnoses   Final diagnoses:  Acute lower UTI (urinary tract infection)  Vertigo    New Prescriptions New Prescriptions   MECLIZINE (ANTIVERT) 25 MG TABLET    Take 1  tablet (25 mg total) by mouth 3 (three) times daily as needed for dizziness.   NITROFURANTOIN, MACROCRYSTAL-MONOHYDRATE, (MACROBID) 100 MG CAPSULE    Take 1 capsule (100 mg total) by mouth 2 (two) times daily.    I personally performed the services described in this documentation, which was scribed in my presence. The recorded information has been reviewed and is accurate.       Pricilla Loveless, MD 03/06/16 [phone]

## 2016-03-08 LAB — URINE CULTURE

## 2016-03-09 ENCOUNTER — Telehealth: Payer: Self-pay

## 2016-03-09 NOTE — Telephone Encounter (Signed)
Post ED Visit - Positive Culture Follow-up  Culture report reviewed by antimicrobial stewardship pharmacist:  []  Taylor Duran, Pharm.Duran. []  Taylor Duran, Pharm.Duran., BCPS []  Taylor Duran, Pharm.Duran. []  Taylor Duran, Pharm.Duran., BCPS []  Taylor Duran, [address] BCPS, AAHIVP []  Taylor Duran, Pharm.Duran., BCPS, AAHIVP []  Taylor Duran, Pharm.Duran. []  Taylor Duran, [address] Taylor Duran Positive urine culture Treated with Nitrofurantoin Monohyd macro, organism sensitive to the same and no further patient follow-up is required at this time.  Taylor Duran, Taylor Duran 03/09/2016, 9:35 AM

## 2016-04-26 ENCOUNTER — Emergency Department (HOSPITAL_COMMUNITY): Payer: Self-pay

## 2016-04-26 ENCOUNTER — Encounter (HOSPITAL_COMMUNITY): Payer: Self-pay | Admitting: Emergency Medicine

## 2016-04-26 ENCOUNTER — Emergency Department (HOSPITAL_COMMUNITY)
Admission: EM | Admit: 2016-04-26 | Discharge: 2016-04-26 | Disposition: A | Discharge status: Home / Self Care | Payer: Self-pay | Attending: Emergency Medicine | Admitting: Emergency Medicine

## 2016-04-26 DIAGNOSIS — R6889 Other general symptoms and signs: Secondary | ICD-10-CM

## 2016-04-26 DIAGNOSIS — R52 Pain, unspecified: Secondary | ICD-10-CM | POA: Insufficient documentation

## 2016-04-26 DIAGNOSIS — R509 Fever, unspecified: Secondary | ICD-10-CM | POA: Insufficient documentation

## 2016-04-26 DIAGNOSIS — J45909 Unspecified asthma, uncomplicated: Secondary | ICD-10-CM | POA: Insufficient documentation

## 2016-04-26 DIAGNOSIS — Z79899 Other long term (current) drug therapy: Secondary | ICD-10-CM | POA: Insufficient documentation

## 2016-04-26 MED ORDER — OSELTAMIVIR PHOSPHATE 75 MG PO CAPS: 75.0000 mg | ORAL_CAPSULE | Freq: Two times a day (BID) | ORAL | 0 refills | Status: DC

## 2016-04-26 NOTE — ED Provider Notes (Signed)
[facility] Provider Note   CSN: [id number] Arrival date & time: 04/26/16  1850  By signing my name below, I, [name], attest that this documentation has been prepared under the direction and in the presence of [name], PA-C. Electronically Signed: [name], Scribe. 04/26/2016. 7:44 PM.  History   Chief Complaint Chief Complaint  Patient presents with  . Flu Like Symptoms    The history is provided by the patient. No language interpreter was used.     HPI Comments: Taylor Duran is a 31 y.o. female who presents to the Emergency Department complaining of persistent subjective fever and chills beginning last night. She reports associated body aches, nasal congestion, rhinorrhea, decreased appetite, cough, and headache. Pt has only tried ibuprofen with minimal improvement of her fever and body aches. She notes her children are sick with similar symptoms. Pt denies nausea, vomiting, diarrhea, or any other associated symptoms.  Past Medical History:  Diagnosis Date  . Anemia   . Asthma   . Heart murmur   . Pregnant     There are no active problems to display for this patient.   Past Surgical History:  Procedure Laterality Date  . DILATION AND CURETTAGE OF UTERUS      OB History    Gravida Para Term Preterm AB Living   6       2 4    SAB TAB Ectopic Multiple Live Births   1 1             Home Medications    Prior to Admission medications   Medication Sig Start Date End Date Taking? Authorizing Provider  albuterol (PROVENTIL HFA;VENTOLIN HFA) 108 (90 BASE) MCG/ACT inhaler Inhale 2 puffs into the lungs every 4 (four) hours as needed for wheezing. 12/29/11   Elson Areas, PA-C  azithromycin (ZITHROMAX Z-PAK) 250 MG tablet Take 2 tabs PO on day 1, 1 tab PO on days 2-5 03/14/15   Renne Crigler, PA-C  benzonatate (TESSALON) 100 MG capsule Take 1 capsule (100 mg total) by mouth 3 (three) times daily as needed for cough. 03/13/15   Antony Madura, PA-C    cetirizine (ZYRTEC ALLERGY) 10 MG tablet Take 1 tablet (10 mg total) by mouth daily. 05/26/15   Danelle Berry, PA-C  cyclobenzaprine (FLEXERIL) 10 MG tablet Take 1 tablet (10 mg total) by mouth 2 (two) times daily as needed for muscle spasms. Patient not taking: Reported on 08/27/2014 10/26/13   Tatyana Kirichenko, PA-C  doxycycline (VIBRAMYCIN) 100 MG capsule Take 1 capsule (100 mg total) by mouth 2 (two) times daily. 03/13/15   Antony Madura, PA-C  fluticasone (FLONASE) 50 MCG/ACT nasal spray Place 2 sprays into both nostrils daily. 05/26/15   Danelle Berry, PA-C  meclizine (ANTIVERT) 25 MG tablet Take 1 tablet (25 mg total) by mouth 3 (three) times daily as needed for dizziness. 03/06/16   Pricilla Loveless, MD  nitrofurantoin, macrocrystal-monohydrate, (MACROBID) 100 MG capsule Take 1 capsule (100 mg total) by mouth 2 (two) times daily. 03/06/16   Pricilla Loveless, MD  sodium chloride (OCEAN) 0.65 % SOLN nasal spray Place 1 spray into both nostrils as needed for congestion. 03/13/15   Antony Madura, PA-C    Family History No family history on file.  Social History Social History  Substance Use Topics  . Smoking status: Never Smoker  . Smokeless tobacco: Not on file  . Alcohol use No     Allergies   Penicillins   Review of Systems Review of  Systems  Constitutional: Positive for appetite change (decreased), chills and fever.  HENT: Positive for congestion and rhinorrhea.   Respiratory: Positive for cough.   Gastrointestinal: Negative for diarrhea, nausea and vomiting.  Musculoskeletal: Positive for myalgias.  Neurological: Positive for headaches.     Physical Exam Updated Vital Signs BP 129/65 (BP Location: Left Arm)   Pulse 97   Temp 99 F (37.2 C) (Oral)   Resp 18   Wt 230 lb (104.3 kg)   LMP 04/25/2016   SpO2 98%   BMI 37.12 kg/m   Physical Exam  Constitutional: She is oriented to person, place, and time. She appears well-developed and well-nourished. No distress.  HENT:  Head:  Normocephalic and atraumatic.  Right Ear: Tympanic membrane, external ear and ear canal normal.  Left Ear: Tympanic membrane, external ear and ear canal normal.  Nose: Nose normal.  Mouth/Throat: Uvula is midline, oropharynx is clear and moist and mucous membranes are normal. No trismus in the jaw. No oropharyngeal exudate, posterior oropharyngeal erythema or tonsillar abscesses.  Eyes: Conjunctivae and EOM are normal. Pupils are equal, round, and reactive to light.  Neck: Normal range of motion. Neck supple. No tracheal deviation present.  Cardiovascular: Normal rate, regular rhythm, S1 normal, S2 normal, normal heart sounds, intact distal pulses and normal pulses.   Pulmonary/Chest: Effort normal. No respiratory distress. She has decreased breath sounds in the left lower field. She has no wheezes. She has no rhonchi. She has no rales.  Decreased breath sounds in the left base.  Abdominal: Normal appearance and bowel sounds are normal. There is no tenderness.  Musculoskeletal: Normal range of motion.  Neurological: She is alert and oriented to person, place, and time.  Skin: Skin is warm and dry.  Psychiatric: She has a normal mood and affect. Her speech is normal and behavior is normal. Thought content normal.  Nursing note and vitals reviewed.  ED Treatments / Results  Labs (all labs ordered are listed, but only abnormal results are displayed) Labs Reviewed - No data to display  EKG  EKG Interpretation None       Radiology Dg Chest 2 View  Result Date: 04/26/2016 CLINICAL DATA:  Generalized body ache and weakness. Dry cough and congestion since yesterday. EXAM: CHEST  2 VIEW COMPARISON:  05/26/2015 FINDINGS: The heart size and mediastinal contours are within normal limits. Both lungs are clear. Stable mild dextroconvex curvature of the mid thoracic spine. No acute osseous abnormality. IMPRESSION: No active cardiopulmonary disease. Electronically Signed   By: Tollie Ethavid  Kwon M.D.   On:  04/26/2016 20:11    Procedures Procedures (including critical care time)  DIAGNOSTIC STUDIES: Oxygen Saturation is *98% on RA, normal by my interpretation.    COORDINATION OF CARE: 7:51 PM Discussed treatment plan with pt at bedside and pt agreed to plan.  Medications Ordered in ED Medications - No data to display   Initial Impression / Assessment and Plan / ED Course  I have reviewed the triage vital signs and the nursing notes.  Pertinent labs & imaging results that were available during my care of the patient were reviewed by me and considered in my medical decision making (see chart for details).  Final Clinical Impressions(s) / ED Diagnoses  I have reviewed and evaluated the relevant imaging studies.  I have reviewed the relevant previous healthcare records. I obtained HPI from historian.  ED Course:  Assessment: Patient with symptoms consistent with influenza.  Vitals are stable, low-grade fever.  No signs of  dehydration, tolerating PO's.  Lungs are clear. CXR negative.  Discussed the cost versus benefit of Tamiflu treatment with the patient. Given Rx Tamiflu.  Patient will be discharged with instructions to orally hydrate, rest, and use over-the-counter medications such as anti-inflammatories ibuprofen and Aleve for muscle aches and Tylenol for fever.  Patient will also be given a cough suppressant.   Disposition/Plan:  DC Home Additional Verbal discharge instructions given and discussed with patient.  Pt Instructed to f/u with PCP in the next week for evaluation and treatment of symptoms. Return precautions given Pt acknowledges and agrees with plan  Supervising Physician Nira Conn, MD  Final diagnoses:  Flu-like symptoms    New Prescriptions New Prescriptions   No medications on file    I personally performed the services described in this documentation, which was scribed in my presence. The recorded information has been reviewed and is accurate.     [name], PA-C 04/26/16 2023    Nira Conn, MD 04/27/16 [phone]

## 2016-04-26 NOTE — ED Triage Notes (Signed)
Pt complaint of flu like symptoms with associated headache, sore throat, cough, body aches, and fever onset last night. Denies n/v/d.

## 2016-04-26 NOTE — Discharge Instructions (Signed)
Please read and follow all provided instructions.  Your diagnoses today include:  1. Flu-like symptoms    Tests performed today include: Chest Xray Vital signs. See below for your results today.   Medications prescribed:   Take any prescribed medications only as directed.  Home care instructions:  Follow any educational materials contained in this packet. Please continue drinking plenty of fluids. Use over-the-counter cold and flu medications as needed as directed on packaging for symptom relief. You may also use ibuprofen or tylenol as directed on packaging for pain or fever.   BE VERY CAREFUL not to take multiple medicines containing Tylenol (also called acetaminophen). Doing so can lead to an overdose which can damage your liver and cause liver failure and possibly death.   Follow-up instructions: Please follow-up with your primary care provider in the next 3 days for further evaluation of your symptoms.   Return instructions:  Please return to the Emergency Department if you experience worsening symptoms. Please return if you have a high fever greater than 101 degrees not controlled with over-the-counter medications, persistent vomiting and cannot keep down fluids, or worsening trouble breathing. Please return if you have any other emergent concerns.  Additional Information:  Your vital signs today were: BP 129/65 (BP Location: Left Arm)    Pulse 97    Temp 99 F (37.2 C) (Oral)    Resp 18    Wt 104.3 kg    LMP 04/25/2016    SpO2 98%    BMI 37.12 kg/m  If your blood pressure (BP) was elevated above 135/85 this visit, please have this repeated by your doctor within one month.

## 2016-05-01 ENCOUNTER — Ambulatory Visit (HOSPITAL_COMMUNITY)
Admission: EM | Admit: 2016-05-01 | Discharge: 2016-05-01 | Disposition: A | Discharge status: Home / Self Care | Payer: Medicaid Other | Attending: Family Medicine | Admitting: Family Medicine

## 2016-05-01 ENCOUNTER — Encounter (HOSPITAL_COMMUNITY): Payer: Self-pay | Admitting: Emergency Medicine

## 2016-05-01 DIAGNOSIS — J0100 Acute maxillary sinusitis, unspecified: Secondary | ICD-10-CM

## 2016-05-01 MED ORDER — SULFAMETHOXAZOLE-TRIMETHOPRIM 800-160 MG PO TABS: 1.0000 | ORAL_TABLET | Freq: Two times a day (BID) | ORAL | 0 refills | Status: DC

## 2016-05-01 MED ORDER — BUTALBITAL-APAP-CAFFEINE 50-325-40 MG PO TABS: 1.0000 | ORAL_TABLET | Freq: Four times a day (QID) | ORAL | 0 refills | Status: DC | PRN

## 2016-05-01 NOTE — Discharge Instructions (Signed)
Afrin (oxymetazoline) will help open up the nasal passages.

## 2016-05-01 NOTE — ED Provider Notes (Signed)
[facility]    CSN: [id number] Arrival date & time: 05/01/16  1158     History   Chief Complaint Chief Complaint  Patient presents with  . URI    HPI Taylor Duran is a 31 y.o. female.   31 year old womanPt c/o cold sx onset: yest (patient was treated for flu one week ago)  Sx include: facial pressure, nasal drainage/congestion, cough  Denies: fevers   Taking: OTC cold meds w/temp relief... Last had Sudafed last night       Past Medical History:  Diagnosis Date  . Anemia   . Asthma   . Heart murmur   . Pregnant     There are no active problems to display for this patient.   Past Surgical History:  Procedure Laterality Date  . DILATION AND CURETTAGE OF UTERUS      OB History    Gravida Para Term Preterm AB Living   6       2 4    SAB TAB Ectopic Multiple Live Births   1 1             Home Medications    Prior to Admission medications   Medication Sig Start Date End Date Taking? Authorizing Provider  ferrous sulfate 325 (65 FE) MG EC tablet Take 325 mg by mouth 3 (three) times daily with meals.   Yes Historical Provider, MD  albuterol (PROVENTIL HFA;VENTOLIN HFA) 108 (90 BASE) MCG/ACT inhaler Inhale 2 puffs into the lungs every 4 (four) hours as needed for wheezing. 12/29/11   Elson Areas, PA-C  butalbital-acetaminophen-caffeine [facility], [facility]) [phone] MG tablet Take 1-2 tablets by mouth every 6 (six) hours as needed for headache. 05/01/16 05/01/17  Elvina Sidle, MD  cetirizine (ZYRTEC ALLERGY) 10 MG tablet Take 1 tablet (10 mg total) by mouth daily. 05/26/15   Danelle Berry, PA-C  fluticasone (FLONASE) 50 MCG/ACT nasal spray Place 2 sprays into both nostrils daily. 05/26/15   Danelle Berry, PA-C  meclizine (ANTIVERT) 25 MG tablet Take 1 tablet (25 mg total) by mouth 3 (three) times daily as needed for dizziness. 03/06/16   Pricilla Loveless, MD  sodium chloride (OCEAN) 0.65 % SOLN nasal spray Place 1 spray into both nostrils as needed for  congestion. 03/13/15   Antony Madura, PA-C  sulfamethoxazole-trimethoprim (BACTRIM DS,SEPTRA DS) 800-160 MG tablet Take 1 tablet by mouth 2 (two) times daily. 05/01/16   Elvina Sidle, MD    Family History History reviewed. No pertinent family history.  Social History Social History  Substance Use Topics  . Smoking status: Never Smoker  . Smokeless tobacco: Never Used  . Alcohol use No     Allergies   Penicillins   Review of Systems Review of Systems  Constitutional: Negative.   HENT: Positive for congestion, facial swelling, sinus pain and sinus pressure.   Respiratory: Negative.   Cardiovascular: Negative.   Genitourinary: Negative.   Neurological: Negative.      Physical Exam Triage Vital Signs ED Triage Vitals  Enc Vitals Group     BP 05/01/16 1226 101/70     Pulse Rate 05/01/16 1226 61     Resp 05/01/16 1226 20     Temp 05/01/16 1226 97.4 F (36.3 C)     Temp Source 05/01/16 1226 Oral     SpO2 05/01/16 1226 100 %     Weight --      Height --      Head Circumference --      Peak  Flow --      Pain Score 05/01/16 1227 9     Pain Loc --      Pain Edu? --      Excl. in GC? --    No data found.   Updated Vital Signs BP 101/70 (BP Location: Left Arm)   Pulse 61   Temp 97.4 F (36.3 C) (Oral)   Resp 20   LMP 04/25/2016   SpO2 100%   Breastfeeding? No    Physical Exam  Constitutional: She is oriented to person, place, and time. She appears well-developed and well-nourished.  HENT:  Head: Normocephalic.  Right Ear: External ear normal.  Left Ear: External ear normal.  Mouth/Throat: Oropharynx is clear and moist.  Marked nasal passage swelling and m/p drainage  Eyes: Conjunctivae are normal. Pupils are equal, round, and reactive to light.  Neck: Normal range of motion. Neck supple.  Cardiovascular: Normal rate, regular rhythm and normal heart sounds.   Pulmonary/Chest: Effort normal and breath sounds normal.  Musculoskeletal: Normal range of motion.   Neurological: She is alert and oriented to person, place, and time.  Skin: Skin is warm and dry.  Nursing note and vitals reviewed.    UC Treatments / Results  Labs (all labs ordered are listed, but only abnormal results are displayed) Labs Reviewed - No data to display  EKG  EKG Interpretation None       Radiology No results found.  Procedures Procedures (including critical care time)  Medications Ordered in UC Medications - No data to display   Initial Impression / Assessment and Plan / UC Course  I have reviewed the triage vital signs and the nursing notes.  Pertinent labs & imaging results that were available during my care of the patient were reviewed by me and considered in my medical decision making (see chart for details).     Final Clinical Impressions(s) / UC Diagnoses   Final diagnoses:  Acute maxillary sinusitis, recurrence not specified    New Prescriptions New Prescriptions   BUTALBITAL-ACETAMINOPHEN-CAFFEINE (FIORICET, ESGIC) 50-325-40 MG TABLET    Take 1-2 tablets by mouth every 6 (six) hours as needed for headache.   SULFAMETHOXAZOLE-TRIMETHOPRIM (BACTRIM DS,SEPTRA DS) 800-160 MG TABLET    Take 1 tablet by mouth 2 (two) times daily.     Elvina SidleKurt Tranquilino Fischler, MD 05/01/16 1236

## 2016-05-01 NOTE — ED Triage Notes (Signed)
Pt c/o cold sx onset: yest  Sx include: facial pressure, nasal drainage/congestion, cough  Denies: fevers   Taking: OTC cold meds w/temp relief... Last had Sudafed last night   A&O x4... NAD

## 2016-05-01 NOTE — ED Notes (Signed)
Wrong pharmacy listed, corrected pharmacy and called antibiotic script to pharmacy of choice

## 2016-08-19 ENCOUNTER — Encounter (HOSPITAL_COMMUNITY): Payer: Self-pay | Admitting: Emergency Medicine

## 2016-08-19 ENCOUNTER — Ambulatory Visit (HOSPITAL_COMMUNITY)
Admission: EM | Admit: 2016-08-19 | Discharge: 2016-08-19 | Disposition: A | Discharge status: Home / Self Care | Payer: Medicaid Other | Attending: Internal Medicine | Admitting: Internal Medicine

## 2016-08-19 DIAGNOSIS — B3731 Acute candidiasis of vulva and vagina: Secondary | ICD-10-CM

## 2016-08-19 DIAGNOSIS — J4521 Mild intermittent asthma with (acute) exacerbation: Secondary | ICD-10-CM

## 2016-08-19 DIAGNOSIS — B373 Candidiasis of vulva and vagina: Secondary | ICD-10-CM

## 2016-08-19 MED ORDER — MICONAZOLE NITRATE 2 % VA CREA: TOPICAL_CREAM | VAGINAL | 1 refills | Status: DC

## 2016-08-19 MED ORDER — FLUCONAZOLE 150 MG PO TABS: 150.0000 mg | ORAL_TABLET | Freq: Every day | ORAL | 0 refills | Status: DC

## 2016-08-19 MED ORDER — PREDNISONE 10 MG (21) PO TBPK: ORAL_TABLET | ORAL | 0 refills | Status: DC

## 2016-08-19 NOTE — ED Triage Notes (Signed)
The patient presented [facility] with a complaint of vaginal itching and burning and itching between her legs x 1 week.

## 2016-08-19 NOTE — ED Provider Notes (Signed)
CSN: [id number]     Arrival date & time 08/19/16  1714 History   First MD Initiated Contact with Patient 08/19/16 1741     Chief Complaint  Patient presents with  . Vaginal Itching   (Consider location/radiation/quality/duration/timing/severity/associated sxs/prior Treatment) C/o vaginal itching and burning for 1 week.   The history is provided by the patient.  Vaginal Itching  This is a new problem. The problem occurs constantly. The problem has not changed since onset.Nothing aggravates the symptoms. Nothing relieves the symptoms. She has tried nothing for the symptoms.    Past Medical History:  Diagnosis Date  . Anemia   . Asthma   . Heart murmur   . Pregnant    Past Surgical History:  Procedure Laterality Date  . DILATION AND CURETTAGE OF UTERUS     History reviewed. No pertinent family history. Social History  Substance Use Topics  . Smoking status: Never Smoker  . Smokeless tobacco: Never Used  . Alcohol use No   OB History    Gravida Para Term Preterm AB Living   6       2 4    SAB TAB Ectopic Multiple Live Births   1 1           Review of Systems  Constitutional: Negative.   HENT: Negative.   Eyes: Negative.   Respiratory: Negative.   Cardiovascular: Negative.   Gastrointestinal: Negative.   Endocrine: Negative.   Genitourinary: Positive for vaginal pain.  Musculoskeletal: Negative.   Skin: Positive for rash.  Allergic/Immunologic: Negative.   Neurological: Negative.     Allergies  Penicillins  Home Medications   Prior to Admission medications   Medication Sig Start Date End Date Taking? Authorizing Provider  albuterol (PROVENTIL HFA;VENTOLIN HFA) 108 (90 BASE) MCG/ACT inhaler Inhale 2 puffs into the lungs every 4 (four) hours as needed for wheezing. 12/29/11  Yes Cheron Schaumann K, PA-C  ferrous sulfate 325 (65 FE) MG EC tablet Take 325 mg by mouth 3 (three) times daily with meals.   Yes [provider]  fluconazole (DIFLUCAN) 150 MG  tablet Take 1 tablet (150 mg total) by mouth daily. 08/19/16   Deatra Canter, FNP  miconazole (MICONAZOLE 7) 2 % vaginal cream May use externally bid 08/19/16   Deatra Canter, FNP  predniSONE (STERAPRED UNI-PAK 21 TAB) 10 MG (21) TBPK tablet Take 4 po qd x2 days then 2po qd x2 days then 1 po qd x 2 days then stop 08/19/16   Deatra Canter, FNP   Meds Ordered and Administered this Visit  Medications - No data to display  BP 127/77 (BP Location: Right Arm)   Pulse 85   Temp 97.4 F (36.3 C) (Oral)   Resp 18   SpO2 98%  No data found.   Physical Exam  Constitutional: She appears well-developed and well-nourished.  HENT:  Head: Normocephalic and atraumatic.  Eyes: Conjunctivae and EOM are normal. Pupils are equal, round, and reactive to light.  Neck: Normal range of motion. Neck supple.  Cardiovascular: Normal rate, regular rhythm and normal heart sounds.   Pulmonary/Chest: Effort normal and breath sounds normal.  Genitourinary:  Genitourinary Comments: Vulvovaginal candidiasis extending to bilateral thighs/ groin  Nursing note and vitals reviewed.   Urgent Care Course     Procedures (including critical care time)  Labs Review Labs Reviewed - No data to display  Imaging Review No results found.   Visual Acuity Review  Right Eye Distance:   Left Eye  Distance:   Bilateral Distance:    Right Eye Near:   Left Eye Near:    Bilateral Near:         MDM   1. Vulvovaginal candidiasis   2. Mild intermittent asthma with acute exacerbation    Diflucan 150mg  one po qd x 5 days #5 Monistat vaginal cream 2% apply bid to external genitalia and groin/ thigh.    Prednisone taper 10mg  4x2 2x2 1x2 then stop #14      Deatra CanterOxford, Susana Duell J, FNP 08/19/16 1807

## 2016-08-19 NOTE — ED Notes (Signed)
At bedside for examination of rash

## 2016-09-03 ENCOUNTER — Emergency Department (HOSPITAL_COMMUNITY)
Admission: EM | Admit: 2016-09-03 | Discharge: 2016-09-03 | Disposition: A | Discharge status: Home / Self Care | Payer: No Typology Code available for payment source | Attending: Emergency Medicine | Admitting: Emergency Medicine

## 2016-09-03 ENCOUNTER — Encounter (HOSPITAL_COMMUNITY): Payer: Self-pay | Admitting: Pharmacy Technician

## 2016-09-03 ENCOUNTER — Emergency Department (HOSPITAL_COMMUNITY): Payer: No Typology Code available for payment source

## 2016-09-03 DIAGNOSIS — Y999 Unspecified external cause status: Secondary | ICD-10-CM | POA: Diagnosis not present

## 2016-09-03 DIAGNOSIS — Y93I9 Activity, other involving external motion: Secondary | ICD-10-CM | POA: Insufficient documentation

## 2016-09-03 DIAGNOSIS — Z7951 Long term (current) use of inhaled steroids: Secondary | ICD-10-CM | POA: Insufficient documentation

## 2016-09-03 DIAGNOSIS — R079 Chest pain, unspecified: Secondary | ICD-10-CM | POA: Insufficient documentation

## 2016-09-03 DIAGNOSIS — M79601 Pain in right arm: Secondary | ICD-10-CM | POA: Diagnosis present

## 2016-09-03 DIAGNOSIS — Y92410 Unspecified street and highway as the place of occurrence of the external cause: Secondary | ICD-10-CM | POA: Diagnosis not present

## 2016-09-03 DIAGNOSIS — M542 Cervicalgia: Secondary | ICD-10-CM | POA: Insufficient documentation

## 2016-09-03 MED ORDER — MELOXICAM 15 MG PO TABS: 15.0000 mg | ORAL_TABLET | Freq: Every day | ORAL | 0 refills | Status: DC

## 2016-09-03 MED ORDER — BACLOFEN 10 MG PO TABS: 10.0000 mg | ORAL_TABLET | Freq: Three times a day (TID) | ORAL | 0 refills | Status: DC

## 2016-09-03 MED ORDER — ACETAMINOPHEN 500 MG PO TABS
1000.0000 mg | ORAL_TABLET | Freq: Once | ORAL | Status: AC
  Administered 2016-09-03: 1000 mg via ORAL
  Filled 2016-09-03: qty 2
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
## 2016-09-03 NOTE — ED Notes (Signed)
Patient transported to CT 

## 2016-09-03 NOTE — ED Notes (Signed)
Pt returned from CT °
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
## 2016-09-03 NOTE — ED Notes (Signed)
Pt ambulated to her room without difficulty. Gait steady and even.

## 2017-01-09 ENCOUNTER — Encounter: Payer: Self-pay | Admitting: Neurology

## 2017-01-09 ENCOUNTER — Ambulatory Visit (INDEPENDENT_AMBULATORY_CARE_PROVIDER_SITE_OTHER): Payer: MEDICAID | Admitting: Neurology

## 2017-01-09 ENCOUNTER — Telehealth: Payer: Self-pay | Admitting: Neurology

## 2017-01-09 VITALS — BP 120/70 | HR 69 | Wt 228.8 lb

## 2017-01-09 DIAGNOSIS — R51 Headache with orthostatic component, not elsewhere classified: Secondary | ICD-10-CM

## 2017-01-09 DIAGNOSIS — G932 Benign intracranial hypertension: Secondary | ICD-10-CM

## 2017-01-09 DIAGNOSIS — H4710 Unspecified papilledema: Secondary | ICD-10-CM

## 2017-01-09 DIAGNOSIS — H5712 Ocular pain, left eye: Secondary | ICD-10-CM

## 2017-01-09 DIAGNOSIS — H532 Diplopia: Secondary | ICD-10-CM

## 2017-01-09 DIAGNOSIS — R519 Headache, unspecified: Secondary | ICD-10-CM

## 2017-01-09 DIAGNOSIS — H547 Unspecified visual loss: Secondary | ICD-10-CM

## 2017-01-09 DIAGNOSIS — R5382 Chronic fatigue, unspecified: Secondary | ICD-10-CM

## 2017-01-09 NOTE — Progress Notes (Signed)
[facility]    Provider:  Dr Lucia Gaskins Referring Provider: Conley Rolls, [facility] [place], [place] Primary Care Physician:    CC:  Optic nerve head edema, headaches  HPI:  Taylor Duran is a 31 y.o. female here as a referral from Dr. Conley Rolls for optic nerve head edema. She is here with her husband. She has a history of headaches but for the past 2 weeks pain in the left eye. Feels like a knofe stabbing, pain starts in the morning and it will last all day, she has to close her eye and hold it, No significant autonomic features. Cobstant headache with stabs, She has had headaches. She has usual headaches left side of the face, pounding, pulsating, throbbing, light and sound sensitivity, movement makes it worse, no nausea or vomiting. Mom has migraines. OTC pain meds do not help. Husband provides much information, she has a headache more half the month all migrainous. No medication overuse. Sometimes her vision is blurry, left > right eyes. +pain on eye movement. The wind may even cause the stabbing. When the stabbing occurs can last over 15 minutes and eye may water a little bit. Touching and wind blowing on it may bring it back on. Daily. Does not wake her up in the middle of the night but is there after waking. Tingling in the left arm. No fever, stiff neck, meningismus.  Reviewed notes, labs and imaging from outside physicians, which showed:  Reviewed my care center notes, she has slight blurred disc margins on her optic nerves left greater than right. She presented for an eye exam on 01/01/2017. Patient says headaches wake her up in the middle the night. Frontal headaches severe to moderate and constant. Slight blurred disc margins on her optic nerve "greater than right. Nausea with the headaches. Tingling of the left arm 2 weeks ago. Pressure behind her left eye. Symptoms started several days prior but headaches have been getting worse for 1 month. Uncorrected visual acuity is 20/40 both eyes. Exam was normal  including pupils, extraocular movements, without an afferent pupillary defect, confrontation normal, slit lamp Normal, Intraocular Pressure Is Normal, Dilated Funduscopic Exam Revealed Slight Blurred Disc Margins, Normal Cup-To-Disc Ratio.   Pain. Most people who develop optic neuritis have eye pain that's worsened by eye movement. Sometimes the pain feels like a dull ache behind the eye. Vision loss in one eye. Most people have at least some temporary reduction in vision, but the extent of loss varies. Noticeable vision loss usually develops over hours or days and improves over several weeks to months. Vision loss is permanent in some cases. Visual field loss. Side vision loss can occur in any pattern. Loss of color vision. Optic neuritis often affects color perception. You might notice that colors appear less vivid than normal. Flashing lights. Some people with optic neuritis report seeing flashing or flickering lights with eye movements.   Review of Systems: Patient complains of symptoms per HPI as well as the following symptoms: eye pain, anemia, headache. Pertinent negatives and positives per HPI. All others negative.   Social History   Social History  . Marital status: Married    Spouse name: N/A  . Number of children: 4  . Years of education: college   Occupational History  . Not on file.   Social History Main Topics  . Smoking status: Never Smoker  . Smokeless tobacco: Never Used  . Alcohol use No  . Drug use: No  . Sexual activity: Not on file  Other Topics Concern  . Not on file   Social History Narrative   Lives at home with husband and children   Right handed   Drinks a lot of caffeine daily    Family History  Problem Relation Age of Onset  . Stomach cancer Maternal Grandmother   . Colon cancer Maternal Grandfather   . Alzheimer's disease Paternal Grandmother   . Parkinson's disease Paternal Grandmother   . Breast cancer Paternal Aunt   . Breast cancer Paternal  Aunt   . Lung cancer Paternal Aunt   . Breast cancer Paternal Aunt     Past Medical History:  Diagnosis Date  . Anemia   . Asthma   . Heart murmur     Past Surgical History:  Procedure Laterality Date  . DILATION AND CURETTAGE OF UTERUS      Current Outpatient Prescriptions  Medication Sig Dispense Refill  . albuterol (PROVENTIL HFA;VENTOLIN HFA) 108 (90 BASE) MCG/ACT inhaler Inhale 2 puffs into the lungs every 4 (four) hours as needed for wheezing. 1 Inhaler 2  . ferrous sulfate 325 (65 FE) MG EC tablet Take 325 mg by mouth 3 (three) times daily with meals.    Marland Kitchen. ibuprofen (ADVIL,MOTRIN) 200 MG tablet Take 600 mg by mouth every 6 (six) hours as needed for headache.     No current facility-administered medications for this visit.     Allergies as of 01/09/2017 - Review Complete 01/09/2017  Allergen Reaction Noted  . Penicillins Itching and Rash 07/17/2011    Vitals: BP 120/70   Pulse 69   Wt 228 lb 12.8 oz (103.8 kg)   BMI 36.93 kg/m  Last Weight:  Wt Readings from Last 1 Encounters:  01/09/17 228 lb 12.8 oz (103.8 kg)   Last Height:   Ht Readings from Last 1 Encounters:  03/05/16 5\' 6"  (1.676 m)   Physical exam: Exam: Gen: NAD, conversant, well nourised, morbidly obese, well groomed                     CV: RRR, no MRG. No Carotid Bruits. No peripheral edema, warm, nontender Eyes: Conjunctivae clear without exudates or hemorrhage  Neuro: Detailed Neurologic Exam  Speech:    Speech is normal; fluent and spontaneous with normal comprehension.  Cognition:    The patient is oriented to person, place, and time;     recent and remote memory intact;     language fluent;     normal attention, concentration,     fund of knowledge Cranial Nerves:    The pupils are equal, round, and reactive to light. +ONH edema bilaterally left> right. Visual fields are full to finger confrontation. Extraocular movements are intact. Trigeminal sensation is intact and the muscles of  mastication are normal. The face is symmetric. The palate elevates in the midline. Hearing intact. Voice is normal. Shoulder shrug is normal. The tongue has normal motion without fasciculations.   Coordination:    Normal finger to nose and heel to shin. Normal rapid alternating movements.   Gait:    Heel-toe and tandem gait are normal.   Motor Observation:    No asymmetry, no atrophy, and no involuntary movements noted. Tone:    Normal muscle tone.    Posture:    Posture is normal. normal erect    Strength:    Strength is V/V in the upper and lower limbs.      Sensation: intact to LT     Reflex Exam:  DTR's:  Deep tendon reflexes in the upper and lower extremities are normal bilaterally.   Toes:    The toes are downgoing bilaterally.   Clonus:    Clonus is absent.       Assessment/Plan:  31 year old patient with chronic headaches, morbid obesity, optic nerve head edema, eye pain, positional headaches, morning headaches, likely a history of migraines, sharp shooting pains in the left eye with possibly autonomic symptoms, continuous severe left sided headache, refractory to over-the-counter pain medications, vision changes.  - For the optic nerve head edema need to evaluate for intracranial hypertension, masses, lesions, multiple sclerosis with MRI of the brain with and without contrast. Also given significant pain in the left eye with worsened papilledema in the left eye need MRI of the orbits with and without contrast for orbital pseudotumor or issues of the globe. 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 - Consider appointment with ophthalmologist Dr. Racheal Patches pending workup - Sleep eval for OSA vs hypoventilation syndrome  Discussed the differential, if symptoms worsen especially  if she has fever, stiff neck, signs of infection, altered mental status, worsening vision, worsening headache or any concerning symptom she has to go to the emergency room immediately.  Orders Placed This Encounter  Procedures  . MR BRAIN W WO CONTRAST  . MR ORBITS W WO CONTRAST  . DG FLUORO GUIDED LOC OF NEEDLE/CATH TIP FOR SPINAL INJECT LT  . Comprehensive metabolic panel  . CBC  . TSH    Discussed: To prevent or relieve headaches, try the following: Cool Compress. Lie down and place a cool compress on your head.  Avoid headache triggers. If certain foods or odors seem to have triggered your migraines in the past, avoid them. A headache diary might help you identify triggers.  Include physical activity in your daily routine. Try a daily walk or other moderate aerobic exercise.  Manage stress. Find healthy ways to cope with the stressors, such as delegating tasks on your to-do list.  Practice relaxation techniques. Try deep breathing, yoga, massage and visualization.  Eat regularly. Eating regularly scheduled meals and maintaining a healthy diet might help prevent headaches. Also, drink plenty of fluids.  Follow a regular sleep schedule. Sleep deprivation might contribute to headaches Consider biofeedback. With this mind-body technique, you learn to control certain bodily functions - such as muscle tension, heart rate and blood pressure - to prevent headaches or reduce headache pain.    Proceed to emergency room if you experience new or worsening symptoms or symptoms do not resolve, if you have new neurologic symptoms or if headache is severe, or for any concerning symptom.   Provided education and documentation from American headache Society toolbox including articles on: chronic migraine medication overuse headache, chronic migraines, prevention of migraines, behavioral and other nonpharmacologic treatments for headache.   Naomie Dean, MD  [facility] [address] [place], [place] [address]  Phone [phone] Fax [phone]

## 2017-01-09 NOTE — Patient Instructions (Addendum)
-  MRI of the brain with and without contrast.  MRI of the orbits with and without contrast. - Lumbar puncture for opening pressure and CSF labs  - continue with testing and follow-up for discussion after results. - Sleep eval for OSA vs hypoventilation syndrome     Idiopathic Intracranial Hypertension Idiopathic intracranial hypertension (IIH) is a condition that increases pressure around the brain. The fluid that surrounds the brain and spinal cord (cerebrospinal fluid, CSF) increases and causes the pressure. Idiopathic means that the cause of this condition is not known. IIH affects the brain and spinal cord (is a neurological disorder). If this condition is not treated, it can cause vision loss or blindness. What increases the risk? You are more likely to develop this condition if:  You are severely overweight (obese).  You are a woman who has not gone through menopause.  You take certain medicines, such as birth control or steroids.  What are the signs or symptoms? Symptoms of IIH include:  Headaches. This is the most common symptom.  Pain in the shoulders or neck.  Nausea and vomiting.  A "rushing water" or pulsing sound within the ears (pulsatile tinnitus).  Double vision.  Blurred vision.  Brief episodes of complete vision loss.  How is this diagnosed? This condition may be diagnosed based on:  Your symptoms.  Your medical history.  CT scan of the brain.  MRI of the brain.  Magnetic resonance venogram (MRV) to check veins in the brain.  Diagnostic lumbar puncture. This is a procedure to remove and examine a sample of cerebrospinal fluid. This procedure can determine whether too much fluid may be causing IIH.  A thorough eye exam to check for swelling or nerve damage in the eyes.  How is this treated? Treatment for this condition depends on your symptoms. The goal of treatment is to decrease the pressure around your brain. Common treatments  include:  Medicines to decrease the production of spinal fluid and lower the pressure within your skull.  Medicines to prevent or treat headaches.  Surgery to place drains (shunts) in your brain to remove excess fluid.  Lumbar puncture to remove excess cerebrospinal fluid.  Follow these instructions at home:  If you are overweight or obese, work with your health care provider to lose weight.  Take over-the-counter and prescription medicines only as told by your health care provider.  Do not drive or use heavy machinery while taking medicines that can make you sleepy.  Keep all follow-up visits as told by your health care provider. This is important. Contact a health care provider if:  You have changes in your vision, such as: ? Double vision. ? Not being able to see colors (color vision). Get help right away if:  You have any of the following symptoms and they get worse or do not get better. ? Headaches. ? Nausea. ? Vomiting. ? Vision changes or difficulty seeing. Summary  Idiopathic intracranial hypertension (IIH) is a condition that increases pressure around the brain. The cause is not known (is idiopathic).  The most common symptom of IIH is headaches.  Treatment may include medicines or surgery to relieve the pressure on your brain. This information is not intended to replace advice given to you by your health care provider. Make sure you discuss any questions you have with your health care provider. Document Released: 05/05/2001 Document Revised: 01/16/2016 Document Reviewed: 01/16/2016 Elsevier Interactive Patient Education  2017 Elsevier Inc.   Migraine Headache A migraine headache is  an intense, throbbing pain on one side or both sides of the head. Migraines may also cause other symptoms, such as nausea, vomiting, and sensitivity to light and noise. What are the causes? Doing or taking certain things may also trigger migraines, such  as: Alcohol. Smoking. Medicines, such as: Medicine used to treat chest pain (nitroglycerine). Birth control pills. Estrogen pills. Certain blood pressure medicines. Aged cheeses, chocolate, or caffeine. Foods or drinks that contain nitrates, glutamate, aspartame, or tyramine. Physical activity.  Other things that may trigger a migraine include: Menstruation. Pregnancy. Hunger. Stress, lack of sleep, too much sleep, or fatigue. Weather changes.  What increases the risk? The following factors may make you more likely to experience migraine headaches: Age. Risk increases with age. Family history of migraine headaches. Being Caucasian. Depression and anxiety. Obesity. Being a woman. Having a hole in the heart (patent foramen ovale) or other heart problems.  What are the signs or symptoms? The main symptom of this condition is pulsating or throbbing pain. Pain may: Happen in any area of the head, such as on one side or both sides. Interfere with daily activities. Get worse with physical activity. Get worse with exposure to bright lights or loud noises.  Other symptoms may include: Nausea. Vomiting. Dizziness. General sensitivity to bright lights, loud noises, or smells.  Before you get a migraine, you may get warning signs that a migraine is developing (aura). An aura may include: Seeing flashing lights or having blind spots. Seeing bright spots, halos, or zigzag lines. Having tunnel vision or blurred vision. Having numbness or a tingling feeling. Having trouble talking. Having muscle weakness.  How is this diagnosed? A migraine headache can be diagnosed based on: Your symptoms. A physical exam. Tests, such as CT scan or MRI of the head. These imaging tests can help rule out other causes of headaches. Taking fluid from the spine (lumbar puncture) and analyzing it (cerebrospinal fluid analysis, or CSF analysis).  How is this treated? A migraine headache is usually  treated with medicines that: Relieve pain. Relieve nausea. Prevent migraines from coming back.  Treatment may also include: Acupuncture. Lifestyle changes like avoiding foods that trigger migraines.  Follow these instructions at home: Medicines Take over-the-counter and prescription medicines only as told by your health care provider. Do not drive or use heavy machinery while taking prescription pain medicine. To prevent or treat constipation while you are taking prescription pain medicine, your health care provider may recommend that you: Drink enough fluid to keep your urine clear or pale yellow. Take over-the-counter or prescription medicines. Eat foods that are high in fiber, such as fresh fruits and vegetables, whole grains, and beans. Limit foods that are high in fat and processed sugars, such as fried and sweet foods. Lifestyle Avoid alcohol use. Do not use any products that contain nicotine or tobacco, such as cigarettes and e-cigarettes. If you need help quitting, ask your health care provider. Get at least 8 hours of sleep every night. Limit your stress. General instructions  Keep a journal to find out what may trigger your migraine headaches. For example, write down: What you eat and drink. How much sleep you get. Any change to your diet or medicines. If you have a migraine: Avoid things that make your symptoms worse, such as bright lights. It may help to lie down in a dark, quiet room. Do not drive or use heavy machinery. Ask your health care provider what activities are safe for you while you are experiencing symptoms.  Keep all follow-up visits as told by your health care provider. This is important. Contact a health care provider if: You develop symptoms that are different or more severe than your usual migraine symptoms. Get help right away if: Your migraine becomes severe. You have a fever. You have a stiff neck. You have vision loss. Your muscles feel weak  or like you cannot control them. You start to lose your balance often. You develop trouble walking. You faint. This information is not intended to replace advice given to you by your health care provider. Make sure you discuss any questions you have with your health care provider. Document Released: 02/24/2005 Document Revised: 09/14/2015 Document Reviewed: 08/13/2015 Elsevier Interactive Patient Education  2017 [employer]

## 2017-01-09 NOTE — Telephone Encounter (Signed)
[facility] has [facility] [phone] .  Fluoroscopy. Thanks Annabelle Harmanana .

## 2017-01-15 ENCOUNTER — Ambulatory Visit
Admission: RE | Admit: 2017-01-15 | Discharge: 2017-01-15 | Disposition: A | Discharge status: Home / Self Care | Payer: Self-pay | Source: Ambulatory Visit | Attending: Neurology | Admitting: Neurology

## 2017-01-15 DIAGNOSIS — R51 Headache with orthostatic component, not elsewhere classified: Secondary | ICD-10-CM

## 2017-01-15 DIAGNOSIS — R5382 Chronic fatigue, unspecified: Secondary | ICD-10-CM

## 2017-01-15 DIAGNOSIS — H532 Diplopia: Secondary | ICD-10-CM

## 2017-01-15 DIAGNOSIS — R519 Headache, unspecified: Secondary | ICD-10-CM

## 2017-01-15 DIAGNOSIS — H4710 Unspecified papilledema: Secondary | ICD-10-CM

## 2017-01-15 DIAGNOSIS — H5712 Ocular pain, left eye: Secondary | ICD-10-CM

## 2017-01-15 DIAGNOSIS — H547 Unspecified visual loss: Secondary | ICD-10-CM

## 2017-01-15 DIAGNOSIS — G932 Benign intracranial hypertension: Secondary | ICD-10-CM

## 2017-01-15 NOTE — Progress Notes (Signed)
Blood drawn from right forearm. Site unremarkable and pt tolerated procedure well. JKL RN

## 2017-01-15 NOTE — Progress Notes (Signed)
Unsuccessful X 2 for blood drawn on right AC. Site is unremarkable.

## 2017-01-15 NOTE — Discharge Instructions (Signed)

## 2017-01-19 ENCOUNTER — Telehealth: Payer: Self-pay | Admitting: Neurology

## 2017-01-19 LAB — CSF CULTURE
MICRO NUMBER: 81258427
RESULT: NO GROWTH
SPECIMEN QUALITY: ADEQUATE

## 2017-01-19 LAB — CSF CULTURE W GRAM STAIN

## 2017-01-19 NOTE — Telephone Encounter (Signed)
Would you call patient please as per below thanks:   Tried calling patient to discuss elevated opening pressure on LP and to start Diamox for IIH. No answer and the voicemail was full at home . Left a detailed message on her cell phone as per DPR instructions. Would like to start her on Diamox and wil see her back in the office in less than 2 months (follow up already scheduled).   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 She should still be evaluated for sleep apnea. Weight loss is key. Verify she has no Sulfa allergy   Most common side effects diamox include tingling, sodas taste flat, decreased appetite, weight loss. Serious but rare side effects include: Abdominal or stomach pain ,fever, chills, or sore throat , lessening of sensations or perception ,loss of appetite , mood or mental changes, including aggression, agitation, apathy, irritability, and mental depression , red, irritated, or bleeding gums , weight loss but there are other side effects.   Asked her to call us back, we will try her again as well.

## 2017-01-21 MED ORDER — ACETAZOLAMIDE 250 MG PO TABS: ORAL_TABLET | ORAL | 5 refills | Status: DC

## 2017-01-21 NOTE — Telephone Encounter (Signed)
I entered Rx for diamox 250 mg and sent to pharm on file: Take 1 pill nightly x 1 week, then 1 pill twice daily x 1 w, then 1 pill in AM and 2 at night x 1 w, then 2 pills 2 times daily thereafter.  Common side effects reported are: Dizziness, lightheadedness, increase in urine output, blurry vision, dry mouth, drowsiness, loss of appetite, upset stomach, headache and tiredness, tingling in the hands and feet and change in taste, especially with carbonated sodas. Most side effects are transient especially during the first few days as the body adjusts to the medication.    Please call pt and advise. Rx entered in Dr. Trevor MaceAhern's absence.

## 2017-01-21 NOTE — Telephone Encounter (Signed)
Patient came to the office. She stated she was unable to hear the message left on her voicemail. I informed her of Dr. Trevor MaceAhern's message regarding elevated opening on LP, IIH, start of Diamox, and side effects of the medication. Patient agreed to start Diamox. She stated she does not have a sulfa allergy, but she does have a pcn allergy. She also mentioned that doesn't have much of an appetite as it is; she has to force herself to eat. She is scheduled to get MRIs this Friday 11/16 and will call GI to clarify the time. She also verbalized understanding that weight loss is key and to follow through with the sleep study referral. The patient pending labs that were written for 11/2. I took her to the lab to have those done. She verbalized her appreciation.

## 2017-01-21 NOTE — Telephone Encounter (Addendum)
I called the patient and reviewed the new prescription for Diamox including the titration instructions and the side effects. She verbalized understanding but asked [facility] [address] instead of [place]. I notified Dr. Frances FurbishAthar and sent Diamox to pt's preferred pharmacy, no changes made to details of prescription. I called [facility] in [place] & cancelled the first prescription.

## 2017-01-21 NOTE — Addendum Note (Signed)
Addended by: Huston FoleyATHAR, Jordon Bourquin on: 01/21/2017 02:00 PM   Modules accepted: Orders

## 2017-01-21 NOTE — Addendum Note (Signed)
Addended by: Bertram SavinULBERTSON, Tema Alire L on: 01/21/2017 02:54 PM   Modules accepted: Orders

## 2017-01-22 LAB — COMPREHENSIVE METABOLIC PANEL
ALBUMIN: 4.1 g/dL (ref 3.5–5.5)
ALT: 8 IU/L (ref 0–32)
AST: 13 IU/L (ref 0–40)
Albumin/Globulin Ratio: 1.4 (ref 1.2–2.2)
Alkaline Phosphatase: 81 IU/L (ref 39–117)
BILIRUBIN TOTAL: 0.3 mg/dL (ref 0.0–1.2)
BUN / CREAT RATIO: 9 (ref 9–23)
BUN: 6 mg/dL (ref 6–20)
CO2: 24 mmol/L (ref 20–29)
CREATININE: 0.68 mg/dL (ref 0.57–1.00)
Calcium: 9.2 mg/dL (ref 8.7–10.2)
Chloride: 102 mmol/L (ref 96–106)
GFR, EST AFRICAN AMERICAN: 136 mL/min/{1.73_m2} (ref >59)
GFR, EST NON AFRICAN AMERICAN: 118 mL/min/{1.73_m2} (ref >59)
GLUCOSE: 84 mg/dL (ref 65–99)
Globulin, Total: 2.9 g/dL (ref 1.5–4.5)
POTASSIUM: 3.8 mmol/L (ref 3.5–5.2)
Sodium: 140 mmol/L (ref 134–144)
TOTAL PROTEIN: 7 g/dL (ref 6.0–8.5)

## 2017-01-22 LAB — CBC
HEMOGLOBIN: 7.6 g/dL — AB (ref 11.1–15.9)
Hematocrit: 26.6 % — ABNORMAL LOW (ref 34.0–46.6)
MCH: 19.1 pg — AB (ref 26.6–33.0)
MCHC: 28.6 g/dL — AB (ref 31.5–35.7)
MCV: 67 fL — ABNORMAL LOW (ref 79–97)
Platelets: 418 10*3/uL — ABNORMAL HIGH (ref 150–379)
RBC: 3.97 x10E6/uL (ref 3.77–5.28)
RDW: 18.4 % — AB (ref 12.3–15.4)
WBC: 5.6 10*3/uL (ref 3.4–10.8)

## 2017-01-22 LAB — TSH: TSH: 2.8 u[IU]/mL (ref 0.450–4.500)

## 2017-01-23 ENCOUNTER — Inpatient Hospital Stay: Admission: RE | Admit: 2017-01-23 | Payer: Self-pay | Source: Ambulatory Visit

## 2017-01-23 ENCOUNTER — Other Ambulatory Visit: Payer: Self-pay

## 2017-01-25 LAB — CNS IGG SYNTHESIS RATE, CSF+BLOOD
ALBUMIN SERUM: 4.6 g/dL (ref 3.5–5.2)
Albumin, CSF: 8.5 mg/dL (ref 8.0–42.0)
CNS-IgG Synthesis Rate: -4.6 mg/24 h (ref <=3.3)
IGG (IMMUNOGLOBIN G), SERUM: 1540 mg/dL (ref 694–1618)
IgG Total CSF: 1.6 mg/dL (ref 0.8–7.7)
IgG-Index: 0.56 (ref <0.66)

## 2017-01-25 LAB — CSF CELL COUNT WITH DIFFERENTIAL
RBC COUNT CSF: 1 {cells}/uL (ref 0–10)
WBC CSF: 2 {cells}/uL (ref 0–5)

## 2017-01-25 LAB — OLIGOCLONAL BANDS, CSF + SERM

## 2017-01-25 LAB — GLUCOSE, CSF: Glucose, CSF: 56 mg/dL (ref 40–80)

## 2017-01-25 LAB — BORRELIA SPECIES DNA, FLUID, PCR: BBURGDNAFLU: NOT DETECTED

## 2017-01-25 LAB — VDRL, CSF: SYPHILIS VDRL QUANT CSF: NONREACTIVE

## 2017-01-25 LAB — PROTEIN, CSF: TOTAL PROTEIN, CSF: 22 mg/dL (ref 15–45)

## 2017-01-26 ENCOUNTER — Telehealth: Payer: Self-pay | Admitting: *Deleted

## 2017-01-26 NOTE — Telephone Encounter (Signed)
-----   Message from Anson FretAntonia B Ahern, MD sent at 01/22/2017 10:38 AM EST ----- Her Hgb is a lower at 7.6, should follow with pcp however her anemia appears to be chronic. otherwise stable.

## 2017-01-26 NOTE — Telephone Encounter (Signed)
Pt has just returned the call to [employer] [facility].  Pt is asking for a call back at [phone]

## 2017-01-26 NOTE — Telephone Encounter (Addendum)
Called and LVM (ok per DPR) informing patient of lab results; hemoglobin 7.6, appears to be chronic, please follow up with PCP for anemia. Otherwise labs stable. Told to call with any questions, left our office number for the patient.

## 2017-01-26 NOTE — Telephone Encounter (Signed)
Called pt back, [phone]. She is aware of Hemoglobin 7.6, low, appears chronic. She will follow up w/ PCP. She had no further concerns.

## 2017-02-10 ENCOUNTER — Other Ambulatory Visit: Payer: Self-pay

## 2017-02-19 ENCOUNTER — Telehealth: Payer: Self-pay | Admitting: *Deleted

## 2017-02-19 NOTE — Telephone Encounter (Signed)
Patient would like a call back to discuss her lab results.

## 2017-02-20 NOTE — Telephone Encounter (Signed)
Thanks, give her a call and see what she needs. She has to follow with her pcp for her anemia. thanks

## 2017-02-20 NOTE — Telephone Encounter (Signed)
Called patient back, I clarified with her that the hemoglobin was 7.6 and to f/u with her PCP. She verbalized understanding and  stated it has been even lower than that before.   Patient changed her cell number. Updated in chart.

## 2017-02-23 ENCOUNTER — Telehealth: Payer: Self-pay

## 2017-02-23 ENCOUNTER — Institutional Professional Consult (permissible substitution): Payer: Self-pay | Admitting: Neurology

## 2017-02-23 NOTE — Telephone Encounter (Signed)
Pt did not show for their appt with Dr. Athar today.  

## 2017-02-24 ENCOUNTER — Encounter (HOSPITAL_COMMUNITY): Payer: Self-pay | Admitting: Family Medicine

## 2017-02-24 DIAGNOSIS — B373 Candidiasis of vulva and vagina: Secondary | ICD-10-CM | POA: Insufficient documentation

## 2017-02-24 DIAGNOSIS — N760 Acute vaginitis: Secondary | ICD-10-CM | POA: Insufficient documentation

## 2017-02-24 DIAGNOSIS — B9689 Other specified bacterial agents as the cause of diseases classified elsewhere: Secondary | ICD-10-CM | POA: Insufficient documentation

## 2017-02-24 DIAGNOSIS — A5901 Trichomonal vulvovaginitis: Secondary | ICD-10-CM | POA: Insufficient documentation

## 2017-02-24 NOTE — ED Triage Notes (Signed)
Patient reports she is experiencing vaginal odor, itching, and "my skin is starting to peel". Symptoms started about a week ago.

## 2017-02-25 ENCOUNTER — Emergency Department (HOSPITAL_COMMUNITY)
Admission: EM | Admit: 2017-02-25 | Discharge: 2017-02-25 | Disposition: A | Discharge status: Home / Self Care | Payer: Self-pay | Attending: Emergency Medicine | Admitting: Emergency Medicine

## 2017-02-25 DIAGNOSIS — B373 Candidiasis of vulva and vagina: Secondary | ICD-10-CM

## 2017-02-25 DIAGNOSIS — A5901 Trichomonal vulvovaginitis: Secondary | ICD-10-CM

## 2017-02-25 DIAGNOSIS — B3731 Acute candidiasis of vulva and vagina: Secondary | ICD-10-CM

## 2017-02-25 DIAGNOSIS — N760 Acute vaginitis: Secondary | ICD-10-CM

## 2017-02-25 DIAGNOSIS — B9689 Other specified bacterial agents as the cause of diseases classified elsewhere: Secondary | ICD-10-CM

## 2017-02-25 LAB — PREGNANCY, URINE: Preg Test, Ur: NEGATIVE

## 2017-02-25 LAB — WET PREP, GENITAL: SPERM: NONE SEEN

## 2017-02-25 LAB — URINALYSIS, ROUTINE W REFLEX MICROSCOPIC
Bilirubin Urine: NEGATIVE
GLUCOSE, UA: NEGATIVE mg/dL
Hgb urine dipstick: NEGATIVE
Ketones, ur: NEGATIVE mg/dL
Nitrite: NEGATIVE
PH: 6 (ref 5.0–8.0)
PROTEIN: NEGATIVE mg/dL
Specific Gravity, Urine: 1.009 (ref 1.005–1.030)

## 2017-02-25 MED ORDER — METRONIDAZOLE 500 MG PO TABS
500.0000 mg | ORAL_TABLET | Freq: Once | ORAL | Status: AC
  Administered 2017-02-25: 500 mg via ORAL
  Filled 2017-02-25: qty 1

## 2017-02-25 MED ORDER — FLUCONAZOLE 150 MG PO TABS: 150.0000 mg | ORAL_TABLET | Freq: Once | ORAL | 0 refills | Status: AC

## 2017-02-25 MED ORDER — METRONIDAZOLE 500 MG PO TABS: 500.0000 mg | ORAL_TABLET | Freq: Two times a day (BID) | ORAL | 0 refills | Status: DC

## 2017-02-25 MED ORDER — FLUCONAZOLE 150 MG PO TABS
150.0000 mg | ORAL_TABLET | Freq: Once | ORAL | Status: AC
  Administered 2017-02-25: 150 mg via ORAL
  Filled 2017-02-25: qty 1

## 2017-02-25 NOTE — ED Notes (Signed)
Pelvic collection placed in room and ready for PA.

## 2017-02-25 NOTE — ED Notes (Signed)
Patient stated she cannot pee right now because she just emptied her bladder.

## 2017-02-25 NOTE — Discharge Instructions (Signed)
Your positive for trichomonas, which is a sexually transmitted infection, as well as yeast today.  You were also found to have clue cells which is characteristic of bacterial vaginosis.  Take Flagyl as prescribed until finished.  Do not engage in sexual intercourse until 1 week after completion of Flagyl.  We recommend that you take the additional tablet of Diflucan after you are finished with Flagyl treatment.  Follow-up with your primary care doctor or OB/GYN to ensure resolution of symptoms.  Ensure all sexual partners are tested and treated for STDs.

## 2017-02-25 NOTE — ED Provider Notes (Signed)
[facility]-EMERGENCY DEPT Provider Note   CSN: [id number] Arrival date & time: 02/24/17  2140     History   Chief Complaint Chief Complaint  Patient presents with  . Vaginal Itching    HPI Taylor Duran is a 31 y.o. female.   31 year old female presents to the emergency department for evaluation of vaginal itching and discharge.  She states that her symptoms feel similar to a past yeast infection.  She usually has associated urinary urgency, frequency with her yeast infections.  She has tried over-the-counter Monistat, but has noticed worsening irritation to her labia.  She states that she has a history of desquamation to her external genitalia with the past yeast infection.  She has not had any fevers, nausea, vomiting.  No significant dysuria.      Past Medical History:  Diagnosis Date  . Anemia   . Asthma   . Heart murmur     There are no active problems to display for this patient.   Past Surgical History:  Procedure Laterality Date  . DILATION AND CURETTAGE OF UTERUS      OB History    Gravida Para Term Preterm AB Living   6       2 4    SAB TAB Ectopic Multiple Live Births   1 1             Home Medications    Prior to Admission medications   Medication Sig Start Date End Date Taking? Authorizing Provider  ferrous sulfate 325 (65 FE) MG EC tablet Take 325 mg by mouth 3 (three) times daily with meals.   Yes [provider]  ibuprofen (ADVIL,MOTRIN) 200 MG tablet Take 600 mg by mouth every 6 (six) hours as needed for headache.   Yes [provider]  fluconazole (DIFLUCAN) 150 MG tablet Take 1 tablet (150 mg total) by mouth once for 1 dose. Take after completion of Flagyl 02/25/17 02/25/17  Antony MaduraHumes, Lamia Mariner, PA-C  metroNIDAZOLE (FLAGYL) 500 MG tablet Take 1 tablet (500 mg total) by mouth 2 (two) times daily. 02/25/17   Antony MaduraHumes, Jamaris Theard, PA-C    Family History Family History  Problem Relation Age of Onset  . Migraines  Mother   . Stomach cancer Maternal Grandmother   . Colon cancer Maternal Grandfather   . Alzheimer's disease Paternal Grandmother   . Parkinson's disease Paternal Grandmother   . Breast cancer Paternal Aunt   . Breast cancer Paternal Aunt   . Lung cancer Paternal Aunt   . Breast cancer Paternal Aunt     Social History Social History   Tobacco Use  . Smoking status: Never Smoker  . Smokeless tobacco: Never Used  Substance Use Topics  . Alcohol use: No  . Drug use: No     Allergies   Penicillins   Review of Systems Review of Systems Ten systems reviewed and are negative for acute change, except as noted in the HPI.    Physical Exam Updated Vital Signs BP 118/79 (BP Location: Left Arm)   Pulse 74   Temp 97.9 F (36.6 C) (Oral)   Resp 16   Ht 5\' 5"  (1.651 m)   Wt 103.4 kg (228 lb)   SpO2 100%   BMI 37.94 kg/m   Physical Exam  Constitutional: She is oriented to person, place, and time. She appears well-developed and well-nourished. No distress.  Nontoxic appearing and in NAD  HENT:  Head: Normocephalic and atraumatic.  Eyes: Conjunctivae and EOM are  normal. No scleral icterus.  Neck: Normal range of motion.  Pulmonary/Chest: Effort normal. No respiratory distress.  Respirations even and unlabored  Genitourinary:  Genitourinary Comments: Mild white, thin discharge in the vaginal vault. Mild erythema to bilateral labial majora. No cervical friability.   Musculoskeletal: Normal range of motion.  Neurological: She is alert and oriented to person, place, and time. She exhibits normal muscle tone. Coordination normal.  Skin: Skin is warm and dry. No rash noted. She is not diaphoretic. No erythema. No pallor.  Psychiatric: She has a normal mood and affect. Her behavior is normal.  Nursing note and vitals reviewed.    ED Treatments / Results  Labs (all labs ordered are listed, but only abnormal results are displayed) Labs Reviewed  WET PREP, GENITAL - Abnormal;  Notable for the following components:      Result Value   Yeast Wet Prep HPF POC PRESENT (*)    Trich, Wet Prep PRESENT (*)    Clue Cells Wet Prep HPF POC PRESENT (*)    WBC, Wet Prep HPF POC MANY (*)    All other components within normal limits  URINALYSIS, ROUTINE W REFLEX MICROSCOPIC - Abnormal; Notable for the following components:   Color, Urine STRAW (*)    Leukocytes, UA MODERATE (*)    Bacteria, UA MANY (*)    Squamous Epithelial / LPF 0-5 (*)    All other components within normal limits  PREGNANCY, URINE  GC/CHLAMYDIA PROBE AMP ([place]) NOT AT [facility]    EKG  EKG Interpretation None       Radiology No results found.  Procedures Procedures (including critical care time)  Medications Ordered in ED Medications  metroNIDAZOLE (FLAGYL) tablet 500 mg (500 mg Oral Given 02/25/17 0229)  fluconazole (DIFLUCAN) tablet 150 mg (150 mg Oral Given 02/25/17 0229)     Initial Impression / Assessment and Plan / ED Course  I have reviewed the triage vital signs and the nursing notes.  Pertinent labs & imaging results that were available during my care of the patient were reviewed by me and considered in my medical decision making (see chart for details).     31 year old female presents for vaginal itching and discharge.  She was found to be positive for trichomonas today.  Patient also with yeast and clue cells.  Gonorrhea and Chlamydia cultures pending.  Urinalysis with bacteriuria but no leukocytes or nitrites.  Plan for discharge with Flagyl as well as 1 tablet of Diflucan to take after completion of metronidazole.    Have advised to follow-up with her primary care doctor or OB/GYN to ensure resolution of symptoms.  Safe sex practices discussed and have encouraged that her husband be tested for STDs.  Return precautions discussed and provided.  Patient discharged in stable condition with no unaddressed concerns.    Final Clinical Impressions(s) / ED Diagnoses   Final  diagnoses:  Yeast vaginitis  BV (bacterial vaginosis)  Trichomoniasis of vagina    ED Discharge Orders        Ordered    metroNIDAZOLE (FLAGYL) 500 MG tablet  2 times daily     02/25/17 0203    fluconazole (DIFLUCAN) 150 MG tablet   Once     02/25/17 0203       Antony MaduraHumes, Kyleen Villatoro, PA-C 02/25/17 [id number]    Ward, Layla MawKristen N, DO 02/25/17 [phone]

## 2017-02-25 NOTE — ED Notes (Signed)
Bed: WA04 Expected date:  Expected time:  Means of arrival:  Comments: 

## 2017-02-26 LAB — GC/CHLAMYDIA PROBE AMP (~~LOC~~) NOT AT ARMC
CHLAMYDIA, DNA PROBE: NEGATIVE
NEISSERIA GONORRHEA: NEGATIVE

## 2017-03-04 ENCOUNTER — Encounter: Payer: Self-pay | Admitting: Neurology

## 2017-03-09 ENCOUNTER — Encounter (HOSPITAL_COMMUNITY): Payer: Self-pay | Admitting: Emergency Medicine

## 2017-03-09 ENCOUNTER — Other Ambulatory Visit: Payer: Self-pay

## 2017-03-09 ENCOUNTER — Emergency Department (HOSPITAL_COMMUNITY)
Admission: EM | Admit: 2017-03-09 | Discharge: 2017-03-09 | Disposition: A | Discharge status: Home / Self Care | Payer: Self-pay | Attending: Emergency Medicine | Admitting: Emergency Medicine

## 2017-03-09 DIAGNOSIS — G43909 Migraine, unspecified, not intractable, without status migrainosus: Secondary | ICD-10-CM | POA: Insufficient documentation

## 2017-03-09 DIAGNOSIS — G43009 Migraine without aura, not intractable, without status migrainosus: Secondary | ICD-10-CM

## 2017-03-09 DIAGNOSIS — J45909 Unspecified asthma, uncomplicated: Secondary | ICD-10-CM | POA: Insufficient documentation

## 2017-03-09 MED ORDER — PROCHLORPERAZINE EDISYLATE 5 MG/ML IJ SOLN
10.0000 mg | Freq: Once | INTRAMUSCULAR | Status: AC
  Administered 2017-03-09: 10 mg via INTRAVENOUS
  Filled 2017-03-09: qty 2

## 2017-03-09 MED ORDER — DIPHENHYDRAMINE HCL 50 MG/ML IJ SOLN
25.0000 mg | Freq: Once | INTRAMUSCULAR | Status: AC
  Administered 2017-03-09: 25 mg via INTRAVENOUS
  Filled 2017-03-09: qty 1

## 2017-03-09 MED ORDER — KETOROLAC TROMETHAMINE 30 MG/ML IJ SOLN
30.0000 mg | Freq: Once | INTRAMUSCULAR | Status: AC
  Administered 2017-03-09: 30 mg via INTRAVENOUS
  Filled 2017-03-09: qty 1

## 2017-03-09 MED ORDER — SODIUM CHLORIDE 0.9 % IV BOLUS (SEPSIS)
1000.0000 mL | Freq: Once | INTRAVENOUS | Status: AC
  Administered 2017-03-09: 1000 mL via INTRAVENOUS

## 2017-03-09 NOTE — ED Provider Notes (Signed)
[facility]   CSN: [id number] Arrival date & time: 03/09/17  0110     History   Chief Complaint Chief Complaint  Patient presents with  . Headache    HPI Taylor ClaymanKelley B Duran is a 31 y.o. female.  HPI Patient presents to the emergency department with migraine headache that started yesterday.  The patient states that she has a history of migraine headaches.  She states that light is bothering her.  The patient states that she did not take any medications prior to arrival.  The patient states that this feels similar to previous headaches.  The patient denies chest pain, shortness of breath,blurred vision, neck pain, fever, cough, weakness, numbness, dizziness, anorexia, edema, abdominal pain,  vomiting, diarrhea, rash, back pain, dysuria, hematemesis, bloody stool, near syncope, or syncope. Past Medical History:  Diagnosis Date  . Anemia   . Asthma   . Heart murmur     There are no active problems to display for this patient.   Past Surgical History:  Procedure Laterality Date  . DILATION AND CURETTAGE OF UTERUS      OB History    Gravida Para Term Preterm AB Living   6       2 4    SAB TAB Ectopic Multiple Live Births   1 1             Home Medications    Prior to Admission medications   Medication Sig Start Date End Date Taking? Authorizing Provider  ferrous sulfate 325 (65 FE) MG EC tablet Take 325 mg by mouth 3 (three) times daily with meals.   Yes [provider]  ibuprofen (ADVIL,MOTRIN) 200 MG tablet Take 600 mg by mouth every 6 (six) hours as needed for headache.   Yes [provider]  metroNIDAZOLE (FLAGYL) 500 MG tablet Take 1 tablet (500 mg total) by mouth 2 (two) times daily. Patient not taking: Reported on 03/09/2017 02/25/17   Antony MaduraHumes, Kelly, PA-C    Family History Family History  Problem Relation Age of Onset  . Migraines Mother   . Stomach cancer Maternal Grandmother   . Colon  cancer Maternal Grandfather   . Alzheimer's disease Paternal Grandmother   . Parkinson's disease Paternal Grandmother   . Breast cancer Paternal Aunt   . Breast cancer Paternal Aunt   . Lung cancer Paternal Aunt   . Breast cancer Paternal Aunt     Social History Social History   Tobacco Use  . Smoking status: Never Smoker  . Smokeless tobacco: Never Used  Substance Use Topics  . Alcohol use: No  . Drug use: No     Allergies   Penicillins   Review of Systems Review of Systems All other systems negative except as documented in the HPI. All pertinent positives and negatives as reviewed in the HPI.  Physical Exam Updated Vital Signs BP 106/70   Pulse 69   Temp 97.6 F (36.4 C) (Oral)   Resp 18   LMP 03/05/2017   SpO2 100%   Physical Exam  Constitutional: She is oriented to person, place, and time. She appears well-developed and well-nourished. No distress.  HENT:  Head: Normocephalic and atraumatic.  Mouth/Throat: Oropharynx is clear and moist.  Eyes: Pupils are equal, round, and reactive to light.  Neck: Normal range of motion. Neck supple.  Cardiovascular: Normal rate, regular rhythm and normal heart sounds. Exam reveals no gallop and no friction rub.  No murmur heard. Pulmonary/Chest: Effort  normal and breath sounds normal. No respiratory distress. She has no wheezes.  Abdominal: Soft. Bowel sounds are normal. She exhibits no distension. There is no tenderness.  Neurological: She is alert and oriented to person, place, and time. She has normal strength. She exhibits normal muscle tone. Coordination and gait normal. GCS eye subscore is 4. GCS verbal subscore is 5. GCS motor subscore is 6.  Skin: Skin is warm and dry. Capillary refill takes less than 2 seconds. No rash noted. No erythema.  Psychiatric: She has a normal mood and affect. Her behavior is normal.  Nursing note and vitals reviewed.    ED Treatments / Results  Labs (all labs ordered are listed, but  only abnormal results are displayed) Labs Reviewed - No data to display  EKG  EKG Interpretation None       Radiology No results found.  Procedures Procedures (including critical care time)  Medications Ordered in ED Medications  sodium chloride 0.9 % bolus 1,000 mL (1,000 mLs Intravenous New Bag/Given 03/09/17 0508)  prochlorperazine (COMPAZINE) injection 10 mg (10 mg Intravenous Given 03/09/17 0508)  ketorolac (TORADOL) 30 MG/ML injection 30 mg (30 mg Intravenous Given 03/09/17 0508)  diphenhydrAMINE (BENADRYL) injection 25 mg (25 mg Intravenous Given 03/09/17 0508)     Initial Impression / Assessment and Plan / ED Course  I have reviewed the triage vital signs and the nursing notes.  Pertinent labs & imaging results that were available during my care of the patient were reviewed by me and considered in my medical decision making (see chart for details).     Patient will be treated for migraine headache.  Have advised her to return here as needed.  Patient is feeling better at this time.  Patient is advised to follow-up with a primary care doctor.  Final Clinical Impressions(s) / ED Diagnoses   Final diagnoses:  None    ED Discharge Orders    None       Charlestine NightLawyer, Graciemae Delisle, PA-C 03/09/17 0601    Ward, Layla MawKristen N, DO 03/09/17 [phone]

## 2017-03-09 NOTE — Discharge Instructions (Signed)
Return here as needed.  Follow up with a primary care doctor °

## 2017-03-09 NOTE — ED Notes (Signed)
Pt reports having a migraine that started yesterday afternoon. Pt reports a history of same.

## 2017-03-09 NOTE — ED Triage Notes (Signed)
C/o headache that started around 1pm Sunday afternoon, went away, and then returned.  Pt reports history of migraines and increased CSF.  Reports nausea, dizziness, and light sensitivity.

## 2017-03-11 ENCOUNTER — Ambulatory Visit: Payer: Self-pay

## 2017-03-11 ENCOUNTER — Ambulatory Visit (INDEPENDENT_AMBULATORY_CARE_PROVIDER_SITE_OTHER): Payer: Self-pay | Admitting: Neurology

## 2017-03-11 ENCOUNTER — Encounter: Payer: Self-pay | Admitting: Neurology

## 2017-03-11 VITALS — BP 108/66 | HR 72 | Ht 65.0 in | Wt 229.0 lb

## 2017-03-11 DIAGNOSIS — G932 Benign intracranial hypertension: Secondary | ICD-10-CM

## 2017-03-11 MED ORDER — TOPIRAMATE ER 100 MG PO SPRINKLE CAP24: 100.0000 mg | EXTENDED_RELEASE_CAPSULE | Freq: Every day | ORAL | 11 refills | Status: DC

## 2017-03-11 NOTE — Patient Instructions (Addendum)
 25mg  then 50mg  then 100mg  at night. Increase every 3 nights. If doing well on 100mg  for 2 weeks then can increase to 150mg    Idiopathic Intracranial Hypertension Idiopathic intracranial hypertension (IIH) is a condition that increases pressure around the brain. The fluid that surrounds the brain and spinal cord (cerebrospinal fluid, CSF) increases and causes the pressure. Idiopathic means that the cause of this condition is not known. IIH affects the brain and spinal cord (is a neurological disorder). If this condition is not treated, it can cause vision loss or blindness. What increases the risk? You are more likely to develop this condition if:  You are severely overweight (obese).  You are a woman who has not gone through menopause.  You take certain medicines, such as birth control or steroids.  What are the signs or symptoms? Symptoms of IIH include:  Headaches. This is the most common symptom.  Pain in the shoulders or neck.  Nausea and vomiting.  A "rushing water" or pulsing sound within the ears (pulsatile tinnitus).  Double vision.  Blurred vision.  Brief episodes of complete vision loss.  How is this diagnosed? This condition may be diagnosed based on:  Your symptoms.  Your medical history.  CT scan of the brain.  MRI of the brain.  Magnetic resonance venogram (MRV) to check veins in the brain.  Diagnostic lumbar puncture. This is a procedure to remove and examine a sample of cerebrospinal fluid. This procedure can determine whether too much fluid may be causing IIH.  A thorough eye exam to check for swelling or nerve damage in the eyes.  How is this treated? Treatment for this condition depends on your symptoms. The goal of treatment is to decrease the pressure around your brain. Common treatments include:  Medicines to decrease the production of spinal fluid and lower the pressure within your skull.  Medicines to prevent or treat headaches.  Surgery  to place drains (shunts) in your brain to remove excess fluid.  Lumbar puncture to remove excess cerebrospinal fluid.  Follow these instructions at home:  If you are overweight or obese, work with your health care provider to lose weight.  Take over-the-counter and prescription medicines only as told by your health care provider.  Do not drive or use heavy machinery while taking medicines that can make you sleepy.  Keep all follow-up visits as told by your health care provider. This is important. Contact a health care provider if:  You have changes in your vision, such as: ? Double vision. ? Not being able to see colors (color vision). Get help right away if:  You have any of the following symptoms and they get worse or do not get better. ? Headaches. ? Nausea. ? Vomiting. ? Vision changes or difficulty seeing. Summary  Idiopathic intracranial hypertension (IIH) is a condition that increases pressure around the brain. The cause is not known (is idiopathic).  The most common symptom of IIH is headaches.  Treatment may include medicines or surgery to relieve the pressure on your brain. This information is not intended to replace advice given to you by your health care provider. Make sure you discuss any questions you have with your health care provider. Document Released: 05/05/2001 Document Revised: 01/16/2016 Document Reviewed: 01/16/2016 Elsevier Interactive Patient Education  2017 [employer]   Topiramate extended-release capsules What is this medicine? TOPIRAMATE (toe PYRE a mate) is used to treat seizures in adults or children with epilepsy. It is also used for the prevention of  migraine headaches. This medicine may be used for other purposes; ask your health care provider or pharmacist if you have questions. COMMON BRAND NAME(S): Trokendi XR What should I tell my health care provider before I take this medicine? They need to know if you have any of these  conditions: -cirrhosis of the liver or liver disease -diarrhea -glaucoma -kidney stones or kidney disease -lung disease like asthma, obstructive pulmonary disease, emphysema -metabolic acidosis -on a ketogenic diet -scheduled for surgery or a procedure -suicidal thoughts, plans, or attempt; a previous suicide attempt by you or a family member -an unusual or allergic reaction to topiramate, other medicines, foods, dyes, or preservatives -pregnant or trying to get pregnant -breast-feeding How should I use this medicine? Take this medicine by mouth with a glass of water. Follow the directions on the prescription label. Trokendi XR capsules must be swallowed whole. Do not sprinkle on food, break, crush, dissolve, or chew. Qudexy XR capsules may be swallowed whole or opened and sprinkled on a small amount of soft food. This mixture must be swallowed immediately. Do not chew or store mixture for later use. You may take this medicine with meals. Take your medicine at regular intervals. Do not take it more often than directed. Talk to your pediatrician regarding the use of this medicine in children. Special care may be needed. While Trokendi XR may be prescribed for children as young as 6 years and Qudexy XR may be prescribed for children as young as 2 years for selected conditions, precautions do apply. Overdosage: If you think you have taken too much of this medicine contact a poison control center or emergency room at once. NOTE: This medicine is only for you. Do not share this medicine with others. What if I miss a dose? If you miss a dose, take it as soon as you can. If it is almost time for your next dose, take only that dose. Do not take double or extra doses. What may interact with this medicine? Do not take this medicine with any of the following medications: -probenecid This medicine may also interact with the following medications: -acetazolamide -alcohol -amitriptyline -birth control  pills -digoxin -hydrochlorothiazide -lithium -medicines for pain, sleep, or muscle relaxation -metformin -methazolamide -other seizure or epilepsy medicines -pioglitazone -risperidone This list may not describe all possible interactions. Give your health care provider a list of all the medicines, herbs, non-prescription drugs, or dietary supplements you use. Also tell them if you smoke, drink alcohol, or use illegal drugs. Some items may interact with your medicine. What should I watch for while using this medicine? Visit your doctor or health care professional for regular checks on your progress. Do not stop taking this medicine suddenly. This increases the risk of seizures if you are using this medicine to control epilepsy. Wear a medical identification bracelet or chain to say you have epilepsy or seizures, and carry a card that lists all your medicines. This medicine can decrease sweating and increase your body temperature. Watch for signs of deceased sweating or fever, especially in children. Avoid extreme heat, hot baths, and saunas. Be careful about exercising, especially in hot weather. Contact your health care provider right away if you notice a fever or decrease in sweating. You should drink plenty of fluids while taking this medicine. If you have had kidney stones in the past, this will help to reduce your chances of forming kidney stones. If you have stomach pain, with nausea or vomiting and yellowing of your eyes or  skin, call your doctor immediately. You may get drowsy, dizzy, or have blurred vision. Do not drive, use machinery, or do anything that needs mental alertness until you know how this medicine affects you. To reduce dizziness, do not sit or stand up quickly, especially if you are an older patient. Alcohol can increase drowsiness and dizziness. Avoid alcoholic drinks. Do not drink alcohol for 6 hours before or 6 hours after taking Trokendi XR. If you notice blurred vision, eye  pain, or other eye problems, seek medical attention at once for an eye exam. The use of this medicine may increase the chance of suicidal thoughts or actions. Pay special attention to how you are responding while on this medicine. Any worsening of mood, or thoughts of suicide or dying should be reported to your health care professional right away. This medicine may increase the chance of developing metabolic acidosis. If left untreated, this can cause kidney stones, bone disease, or slowed growth in children. Symptoms include breathing fast, fatigue, loss of appetite, irregular heartbeat, or loss of consciousness. Call your doctor immediately if you experience any of these side effects. Also, tell your doctor about any surgery you plan on having while taking this medicine since this may increase your risk for metabolic acidosis. Birth control pills may not work properly while you are taking this medicine. Talk to your doctor about using an extra method of birth control. Women who become pregnant while using this medicine may enroll in the [place] American Antiepileptic Drug Pregnancy Registry by calling [phone]. This registry collects information about the safety of antiepileptic drug use during pregnancy. What side effects may I notice from receiving this medicine? Side effects that you should report to your doctor or health care professional as soon as possible: -allergic reactions like skin rash, itching or hives, swelling of the face, lips, or tongue -decreased sweating and/or rise in body temperature -depression -difficulty breathing, fast or irregular breathing patterns -difficulty speaking -difficulty walking or controlling muscle movements -hearing impairment -redness, blistering, peeling or loosening of the skin, including inside the mouth -tingling, pain or numbness in the hands or feet -unusually weak or tired -worsening of mood, thoughts or actions of suicide or dying Side effects  that usually do not require medical attention (report to your doctor or health care professional if they continue or are bothersome): -altered taste -back pain, joint or muscle aches and pains -diarrhea, or constipation -headache -loss of appetite -nausea -stomach upset, indigestion -tremors This list may not describe all possible side effects. Call your doctor for medical advice about side effects. You may report side effects to FDA at 1-800-FDA-1088. Where should I keep my medicine? Keep out of the reach of children. Store at room temperature between 15 and 30 degrees C (59 and 86 degrees F) in a tightly closed container. Protect from moisture. Throw away any unused medicine after the expiration date. NOTE: This sheet is a summary. It may not cover all possible information. If you have questions about this medicine, talk to your doctor, pharmacist, or health care provider.  2018 Elsevier/Gold Standard (2015-06-15 12:33:11)

## 2017-03-11 NOTE — Progress Notes (Signed)
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 Started on Diamox but she could not afford it.  She has had tubal ligation. She has not started diamox, could not afford it and did not call us. Has not gotten the MRIs either.  Discussed today that if she has problems with medication she should call our office as we could help her or change her medications.  Today we will start topiramate, provided patient with multiple months of samples.  Discussed compliance and the risks of permanent vision loss with intracranial idiopathic hypertension, also discussed weight loss and advised her to see Dr. Quillian Quince in the medical weight loss center.  Discussed weight loss as an interval part of treating this disorder in addition to medications such as topiramate.  Her Medicaid has not been approved, discuss [employer] financial assistance and provided her with an application however she may already have that she is not sure.  Scheduled the MRI of her brain and orbits at [facility] for next week to rule out any other etiology of increased pressure.  Patient may need an MRV of the brain, discussed this and highly encouraged but at this time patient is self-pay and cannot afford it.  Asked her to follow-up with [employer] financial assistance to see if MRIs are covered and in that way we could do the MRV.  HPI:  Taylor Duran is a 32 y.o. female here as a referral from Dr. Conley Rolls for optic nerve head edema. She is here with her husband. She has a history of headaches but for the past 2 weeks pain in the left eye. Feels like a knofe stabbing, pain starts in the morning and it will last all day, she has to close her eye and hold it, No significant autonomic features. Cobstant headache with stabs, She has had headaches. She has usual headaches left side of the face, pounding, pulsating, throbbing, light and sound sensitivity,  movement makes it worse, no nausea or vomiting. Mom has migraines. OTC pain meds do not help. Husband provides much information, she has a headache more half the month all migrainous. No medication overuse. Sometimes her vision is blurry, left > right eyes. +pain on eye movement. The wind may even cause the stabbing. When the stabbing occurs can last over 15 minutes and eye may water a little bit. Touching and wind blowing on it may bring it back on. Daily. Does not wake her up in the middle of the night but is there after waking. Tingling in the left arm. No fever, stiff neck, meningismus.  Reviewed notes, labs and imaging from outside physicians, which showed:  Reviewed my care center notes, she has slight blurred disc margins on her optic nerves left greater than right. She presented for an eye exam on 01/01/2017. Patient says headaches wake her up in the middle the night. Frontal headaches severe to moderate and constant. Slight blurred disc margins on her optic nerve "greater than right. Nausea with the headaches. Tingling of the left arm 2 weeks ago. Pressure behind her left eye. Symptoms started several days prior but headaches have been getting worse for 1 month. Uncorrected visual acuity is 20/40 both eyes. Exam was normal including pupils, extraocular movements, without an afferent pupillary defect, confrontation normal, slit lamp Normal, Intraocular Pressure Is Normal, Dilated Funduscopic Exam Revealed Slight Blurred Disc Margins, Normal Cup-To-Disc Ratio.   Pain. Most  people who develop optic neuritis have eye pain that's worsened by eye movement. Sometimes the pain feels like a dull ache behind the eye. Vision loss in one eye. Most people have at least some temporary reduction in vision, but the extent of loss varies. Noticeable vision loss usually develops over hours or days and improves over several weeks to months. Vision loss is permanent in some cases. Visual field loss. Side vision loss  can occur in any pattern. Loss of color vision. Optic neuritis often affects color perception. You might notice that colors appear less vivid than normal. Flashing lights. Some people with optic neuritis report seeing flashing or flickering lights with eye movements.   Review of Systems: Patient complains of symptoms per HPI as well as the following symptoms: eye pain, anemia, headache. Pertinent negatives and positives per HPI. All others negative.     Social History   Socioeconomic History  . Marital status: Married    Spouse name: Not on file  . Number of children: 4  . Years of education: college  . Highest education level: Not on file  Social Needs  . Financial resource strain: Not on file  . Food insecurity - worry: Not on file  . Food insecurity - inability: Not on file  . Transportation needs - medical: Not on file  . Transportation needs - non-medical: Not on file  Occupational History  . Not on file  Tobacco Use  . Smoking status: Never Smoker  . Smokeless tobacco: Never Used  Substance and Sexual Activity  . Alcohol use: No  . Drug use: No  . Sexual activity: Not on file  Other Topics Concern  . Not on file  Social History Narrative   Lives at home with husband and children   Right handed   Drinks about 6 cups of caffeine daily    Family History  Problem Relation Age of Onset  . Migraines Mother   . Stomach cancer Maternal Grandmother   . Colon cancer Maternal Grandfather   . Alzheimer's disease Paternal Grandmother   . Parkinson's disease Paternal Grandmother   . Breast cancer Paternal Aunt   . Breast cancer Paternal Aunt   . Lung cancer Paternal Aunt   . Breast cancer Paternal Aunt     Past Medical History:  Diagnosis Date  . Anemia   . Asthma   . Heart murmur     Past Surgical History:  Procedure Laterality Date  . DILATION AND CURETTAGE OF UTERUS      Current Outpatient Medications  Medication Sig Dispense Refill  . ferrous sulfate  325 (65 FE) MG EC tablet Take 325 mg by mouth 3 (three) times daily with meals.    Marland Kitchen. ibuprofen (ADVIL,MOTRIN) 200 MG tablet Take 600 mg by mouth every 6 (six) hours as needed for headache.     No current facility-administered medications for this visit.     Allergies as of 03/11/2017 - Review Complete 03/11/2017  Allergen Reaction Noted  . Penicillins Itching and Rash 07/17/2011    Vitals: BP 108/66 (BP Location: Right Arm, Patient Position: Sitting)   Pulse 72   Ht 5\' 5"  (1.651 m)   Wt 229 lb (103.9 kg)   LMP 03/05/2017   BMI 38.11 kg/m  Last Weight:  Wt Readings from Last 1 Encounters:  03/11/17 229 lb (103.9 kg)   Last Height:   Ht Readings from Last 1 Encounters:  03/11/17 5\' 5"  (1.651 m)       Assessment/Plan:  32 year old patient with chronic headaches, morbid obesity, optic nerve head edema, eye pain, positional headaches, morning headaches, likely a history of migraines, sharp shooting pains in the left eye with possibly autonomic symptoms, continuous severe left sided headache, refractory to over-the-counter pain medications, vision changes. Likely IIH as opening pressure was 33.    - For the optic nerve head edema need to evaluate for intracranial hypertension, masses, lesions, multiple sclerosis with MRI of the brain with and without contrast. Also given significant pain in the left eye with worsened papilledema in the left eye need MRI of the orbits with and without contrast for orbital pseudotumor or issues of the globe.  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   - Consider appointment with ophthalmologist Dr. Racheal Patches pending workup. She cannot until Medicaid approved due to financial reasons.  - Sleep eval for OSA vs hypoventilation syndrome. She did not show for  appointment, encouraged her to reschedule.   - Discussed that this is likely IIH but we still need MRI of the brain and orbits to rule out other causes.  MRV would also be helpful however at this time she cannot afford it.  Discuss [employer] financial assistance, it is unclear if she has it or not she is going to look into it and I provided her documentation.  She should call and see if imaging will be covered then we could order MRV, she understands her responsibility to let me know.  - She did not start Diamox because she could not afford it, she did not call our office.  Discussed compliance.  Discussed always calling our office if she runs into any problems or side effects.  Started topiramate extended release gave her samples at this time due to noninsurance status.  Discussed weight loss in the healthy weight and wellness center here at [employer], weight loss is an interval part of treatment for this disorder.  Untreated she could have permanent vision loss and even blindness.  Taylor Dean, MD  [facility] [address] [place], [place] [address]  Phone [phone] Fax [phone]  A total of 25 minutes was spent face-to-face with this patient. Over half this time was spent on counseling patient on the IIH, morbid obesitydiagnosis and different diagnostic and therapeutic options available.

## 2017-03-16 ENCOUNTER — Ambulatory Visit (HOSPITAL_COMMUNITY): Payer: Self-pay

## 2017-03-16 ENCOUNTER — Ambulatory Visit (HOSPITAL_COMMUNITY): Payer: Self-pay | Attending: Neurology

## 2017-04-15 ENCOUNTER — Institutional Professional Consult (permissible substitution): Payer: Self-pay | Admitting: Neurology

## 2017-04-15 NOTE — Telephone Encounter (Signed)
Pt did not show for her appt with Dr. Frances FurbishAthar. This is pt's second sleep consult no show.  Will send to Dr. Frances FurbishAthar and Karoline CaldwellAngie for review.

## 2017-04-15 NOTE — Telephone Encounter (Signed)
Please follow dismissal protocol for new patients as per our No Show Policy. This is an established patient for Dr. Lucia GaskinsAhern, as far as her FU, it is up to her.

## 2017-04-16 ENCOUNTER — Encounter: Payer: Self-pay | Admitting: Neurology

## 2017-05-12 ENCOUNTER — Ambulatory Visit: Payer: Self-pay | Admitting: Neurology

## 2017-06-05 ENCOUNTER — Encounter (HOSPITAL_COMMUNITY): Payer: Self-pay | Admitting: Family Medicine

## 2017-06-05 ENCOUNTER — Ambulatory Visit (HOSPITAL_COMMUNITY)
Admission: EM | Admit: 2017-06-05 | Discharge: 2017-06-05 | Disposition: A | Discharge status: Home / Self Care | Payer: Self-pay | Attending: Family Medicine | Admitting: Family Medicine

## 2017-06-05 DIAGNOSIS — J45909 Unspecified asthma, uncomplicated: Secondary | ICD-10-CM | POA: Insufficient documentation

## 2017-06-05 DIAGNOSIS — Z880 Allergy status to penicillin: Secondary | ICD-10-CM | POA: Insufficient documentation

## 2017-06-05 DIAGNOSIS — N898 Other specified noninflammatory disorders of vagina: Secondary | ICD-10-CM

## 2017-06-05 DIAGNOSIS — Z79899 Other long term (current) drug therapy: Secondary | ICD-10-CM | POA: Insufficient documentation

## 2017-06-05 LAB — POCT PREGNANCY, URINE: Preg Test, Ur: NEGATIVE

## 2017-06-05 MED ORDER — FLUCONAZOLE 150 MG PO TABS: 150.0000 mg | ORAL_TABLET | Freq: Every day | ORAL | 0 refills | Status: DC

## 2017-06-05 NOTE — Discharge Instructions (Signed)
You were treated empirically for yeast. Start diflucan as directed. Cytology sent, you will be contacted with any positive results that requires further treatment. Refrain from sexual activity and alcohol use for the next 7 days. Monitor for any worsening of symptoms, fever, abdominal pain, nausea, vomiting, to follow up for reevaluation. ° °

## 2017-06-05 NOTE — ED Triage Notes (Signed)
Pt here for vaginal itching and irritation x 3 week. She has used multiple OTC meds without relief.

## 2017-06-05 NOTE — ED Provider Notes (Signed)
[facility]    CSN: [id number] Arrival date & time: 06/05/17  1428     History   Chief Complaint Chief Complaint  Patient presents with  . Vaginitis    HPI Taylor Duran is a 32 y.o. female.   32 year old female comes in for 3-week history of vaginal itching and irritation.  She denies any spotting.  Describes discharge as clumpy.  Denies fever, chills, night sweats.  Denies abdominal pain, nausea, vomiting.  LMP 05/09/2017.  Sexually active with one female partner, no condom use. Tubal ligation.  Denies any changes in hygiene products.  States has history of frequent yeast infections, usually Monistat resolves the symptoms, has tried Monistat without improvement this time.     Past Medical History:  Diagnosis Date  . Anemia   . Asthma   . Heart murmur     Patient Active Problem List   Diagnosis Date Noted  . IIH (idiopathic intracranial hypertension) 03/11/2017  . Morbid obesity (HCC) 03/11/2017    Past Surgical History:  Procedure Laterality Date  . DILATION AND CURETTAGE OF UTERUS      OB History    Gravida  6   Para      Term      Preterm      AB  2   Living  4     SAB  1   TAB  1   Ectopic      Multiple      Live Births               Home Medications    Prior to Admission medications   Medication Sig Start Date End Date Taking? Authorizing Provider  ferrous sulfate 325 (65 FE) MG EC tablet Take 325 mg by mouth 3 (three) times daily with meals.    [provider]  fluconazole (DIFLUCAN) 150 MG tablet Take 1 tablet (150 mg total) by mouth daily. Take second dose 72 hours later if symptoms still persists. 06/05/17   Cathie Hoops, Blessyn [place] V, PA-C  ibuprofen (ADVIL,MOTRIN) 200 MG tablet Take 600 mg by mouth every 6 (six) hours as needed for headache.    [provider]  Topiramate ER (QUDEXY XR) 100 MG CS24 sprinkle capsule Take 100 mg by mouth at bedtime. 03/11/17   Anson Fret, MD    Family History Family History    Problem Relation Age of Onset  . Migraines Mother   . Stomach cancer Maternal Grandmother   . Colon cancer Maternal Grandfather   . Alzheimer's disease Paternal Grandmother   . Parkinson's disease Paternal Grandmother   . Breast cancer Paternal Aunt   . Breast cancer Paternal Aunt   . Lung cancer Paternal Aunt   . Breast cancer Paternal Aunt     Social History Social History   Tobacco Use  . Smoking status: Never Smoker  . Smokeless tobacco: Never Used  Substance Use Topics  . Alcohol use: No  . Drug use: No     Allergies   Penicillins   Review of Systems Review of Systems  Reason unable to perform ROS: See HPI as above.     Physical Exam Triage Vital Signs ED Triage Vitals  Enc Vitals Group     BP 06/05/17 1443 115/60     Pulse Rate 06/05/17 1443 75     Resp 06/05/17 1443 18     Temp 06/05/17 1443 98.3 F (36.8 C)     Temp src --  SpO2 06/05/17 1443 100 %     Weight --      Height --      Head Circumference --      Peak Flow --      Pain Score 06/05/17 1442 0     Pain Loc --      Pain Edu? --      Excl. in GC? --    No data found.  Updated Vital Signs BP 115/60   Pulse 75   Temp 98.3 F (36.8 C)   Resp 18   LMP 04/11/2017   SpO2 100%   Physical Exam  Constitutional: She is oriented to person, place, and time. She appears well-developed and well-nourished. No distress.  HENT:  Head: Normocephalic and atraumatic.  Eyes: Pupils are equal, round, and reactive to light. Conjunctivae are normal.  Neurological: She is alert and oriented to person, place, and time.     UC Treatments / Results  Labs (all labs ordered are listed, but only abnormal results are displayed) Labs Reviewed  POCT PREGNANCY, URINE  URINE CYTOLOGY ANCILLARY ONLY    EKG None Radiology No results found.  Procedures Procedures (including critical care time)  Medications Ordered in UC Medications - No data to display   Initial Impression / Assessment and  Plan / UC Course  I have reviewed the triage vital signs and the nursing notes.  Pertinent labs & imaging results that were available during my care of the patient were reviewed by me and considered in my medical decision making (see chart for details).    Patient was treated empirically for yeast. Start diflucan as directed. Cytology sent, patient will be contacted with any positive results that require additional treatment. Patient to refrain from sexual activity for the next 7 days. Return precautions given.    Final Clinical Impressions(s) / UC Diagnoses   Final diagnoses:  Vaginal discharge  Vaginal irritation    ED Discharge Orders        Ordered    fluconazole (DIFLUCAN) 150 MG tablet  Daily     06/05/17 1520       Belinda FisherYu, Eudell Mcphee V, [place] JerseyPA-C 06/05/17 1542

## 2017-06-08 LAB — URINE CYTOLOGY ANCILLARY ONLY
CHLAMYDIA, DNA PROBE: NEGATIVE
NEISSERIA GONORRHEA: NEGATIVE
Trichomonas: NEGATIVE

## 2017-06-10 LAB — URINE CYTOLOGY ANCILLARY ONLY
BACTERIAL VAGINITIS: POSITIVE — AB
CANDIDA VAGINITIS: POSITIVE — AB

## 2017-06-11 ENCOUNTER — Telehealth (HOSPITAL_COMMUNITY): Payer: Self-pay

## 2017-06-11 MED ORDER — METRONIDAZOLE 500 MG PO TABS: 500.0000 mg | ORAL_TABLET | Freq: Two times a day (BID) | ORAL | 0 refills | Status: DC

## 2017-06-11 NOTE — Telephone Encounter (Signed)
Pt called to inform of results from previous visit. Pt complains of vaginal irritation persisting. Prescription for Metronidazole sent to pharmacy of choice per Dr. Dayton ScrapeMurray.

## 2017-07-17 ENCOUNTER — Ambulatory Visit (HOSPITAL_COMMUNITY)
Admission: EM | Admit: 2017-07-17 | Discharge: 2017-07-17 | Disposition: A | Discharge status: Home / Self Care | Payer: Self-pay | Attending: Family Medicine | Admitting: Family Medicine

## 2017-07-17 ENCOUNTER — Encounter (HOSPITAL_COMMUNITY): Payer: Self-pay | Admitting: Emergency Medicine

## 2017-07-17 DIAGNOSIS — Z79899 Other long term (current) drug therapy: Secondary | ICD-10-CM | POA: Insufficient documentation

## 2017-07-17 DIAGNOSIS — Z803 Family history of malignant neoplasm of breast: Secondary | ICD-10-CM | POA: Insufficient documentation

## 2017-07-17 DIAGNOSIS — B3731 Acute candidiasis of vulva and vagina: Secondary | ICD-10-CM

## 2017-07-17 DIAGNOSIS — Z801 Family history of malignant neoplasm of trachea, bronchus and lung: Secondary | ICD-10-CM | POA: Insufficient documentation

## 2017-07-17 DIAGNOSIS — B373 Candidiasis of vulva and vagina: Secondary | ICD-10-CM | POA: Insufficient documentation

## 2017-07-17 DIAGNOSIS — Z6833 Body mass index (BMI) 33.0-33.9, adult: Secondary | ICD-10-CM | POA: Insufficient documentation

## 2017-07-17 DIAGNOSIS — Z800 Family history of malignant neoplasm of digestive organs: Secondary | ICD-10-CM | POA: Insufficient documentation

## 2017-07-17 DIAGNOSIS — G932 Benign intracranial hypertension: Secondary | ICD-10-CM | POA: Insufficient documentation

## 2017-07-17 DIAGNOSIS — N760 Acute vaginitis: Secondary | ICD-10-CM

## 2017-07-17 DIAGNOSIS — Z791 Long term (current) use of non-steroidal anti-inflammatories (NSAID): Secondary | ICD-10-CM | POA: Insufficient documentation

## 2017-07-17 DIAGNOSIS — Z880 Allergy status to penicillin: Secondary | ICD-10-CM | POA: Insufficient documentation

## 2017-07-17 DIAGNOSIS — Z9889 Other specified postprocedural states: Secondary | ICD-10-CM | POA: Insufficient documentation

## 2017-07-17 DIAGNOSIS — Z820 Family history of epilepsy and other diseases of the nervous system: Secondary | ICD-10-CM | POA: Insufficient documentation

## 2017-07-17 DIAGNOSIS — R21 Rash and other nonspecific skin eruption: Secondary | ICD-10-CM | POA: Insufficient documentation

## 2017-07-17 DIAGNOSIS — Z8489 Family history of other specified conditions: Secondary | ICD-10-CM | POA: Insufficient documentation

## 2017-07-17 DIAGNOSIS — B9689 Other specified bacterial agents as the cause of diseases classified elsewhere: Secondary | ICD-10-CM | POA: Insufficient documentation

## 2017-07-17 MED ORDER — FLUCONAZOLE 150 MG PO TABS: ORAL_TABLET | ORAL | 0 refills | Status: DC

## 2017-07-17 MED ORDER — METRONIDAZOLE 500 MG PO TABS: 500.0000 mg | ORAL_TABLET | Freq: Two times a day (BID) | ORAL | 0 refills | Status: DC

## 2017-07-17 MED ORDER — KETOCONAZOLE 2 % EX CREA: 1.0000 "application " | TOPICAL_CREAM | Freq: Every day | CUTANEOUS | 0 refills | Status: DC

## 2017-07-17 NOTE — ED Triage Notes (Signed)
PT reports vaginal discharge, itching, raw skin for a month. PT was seen here recently, no pelvic was done. PT was given a med here, symptoms improved, but returned 3 days later.

## 2017-07-17 NOTE — Discharge Instructions (Signed)
Take the metronidazole for 7 days Start this tomorrow Today and once a week for 4 weeks take a diflucan tab Use the cream in addition Take a probiotic with lactobacillus daily for prevention Follow up with GYN if fails to improve

## 2017-07-17 NOTE — ED Triage Notes (Signed)
PT was prescribed diflucan in late march, but only took one. PT started flagyl 4/4 and took the full course.

## 2017-07-17 NOTE — ED Provider Notes (Signed)
[facility]    CSN: [id number] Arrival date & time: 07/17/17  1005     History   Chief Complaint Chief Complaint  Patient presents with  . Vaginal Discharge    HPI [name] is a 32 y.o. female.   HPI  Patient is here for recurring vaginal discomfort.  She has recurring BV and yeast infections.  Currently she has some vaginal discharge, malodor, and itching.  She also has a rash on her outer vaginal region that is swelling, peeling, and painful.  She has been on Diflucan, she has been on metronidazole, she has used over-the-counter creams for yeast.  She has been unable to get rid of the symptoms.  Her husband is asymptomatic.  She has not been with anybody else, she states for 15 years.  No suspicion of STD.  No abdominal pain.  No fever.  Her tubes are tied and she is certain she is not pregnant.  Past Medical History:  Diagnosis Date  . Anemia   . Asthma   . Heart murmur     Patient Active Problem List   Diagnosis Date Noted  . IIH (idiopathic intracranial hypertension) 03/11/2017  . Morbid obesity (HCC) 03/11/2017    Past Surgical History:  Procedure Laterality Date  . DILATION AND CURETTAGE OF UTERUS      OB History    Gravida  6   Para      Term      Preterm      AB  2   Living  4     SAB  1   TAB  1   Ectopic      Multiple      Live Births               Home Medications    Prior to Admission medications   Medication Sig Start Date End Date Taking? Authorizing Provider  Topiramate ER (QUDEXY XR) 100 MG CS24 sprinkle capsule Take 100 mg by mouth at bedtime. 03/11/17  Yes Anson Fret, MD  ferrous sulfate 325 (65 FE) MG EC tablet Take 325 mg by mouth 3 (three) times daily with meals.    [provider]  fluconazole (DIFLUCAN) 150 MG tablet Take one a week for 4 weeks 07/17/17   Eustace Moore, MD  ibuprofen (ADVIL,MOTRIN) 200 MG tablet Take 600 mg by mouth every 6 (six) hours as needed for headache.     [provider]  ketoconazole (NIZORAL) 2 % cream Apply 1 application topically daily. 07/17/17   Eustace Moore, MD  metroNIDAZOLE (FLAGYL) 500 MG tablet Take 1 tablet (500 mg total) by mouth 2 (two) times daily. 07/17/17   Eustace Moore, MD    Family History Family History  Problem Relation Age of Onset  . Migraines Mother   . Stomach cancer Maternal Grandmother   . Colon cancer Maternal Grandfather   . Alzheimer's disease Paternal Grandmother   . Parkinson's disease Paternal Grandmother   . Breast cancer Paternal Aunt   . Breast cancer Paternal Aunt   . Lung cancer Paternal Aunt   . Breast cancer Paternal Aunt     Social History Social History   Tobacco Use  . Smoking status: Never Smoker  . Smokeless tobacco: Never Used  Substance Use Topics  . Alcohol use: No  . Drug use: No     Allergies   Penicillins   Review of Systems Review of Systems  Constitutional: Negative for chills  and fever.  HENT: Negative for ear pain and sore throat.   Eyes: Negative for pain and visual disturbance.  Respiratory: Negative for cough and shortness of breath.   Cardiovascular: Negative for chest pain and palpitations.  Gastrointestinal: Negative for abdominal pain and vomiting.  Genitourinary: Positive for vaginal discharge and vaginal pain. Negative for dysuria and hematuria.  Musculoskeletal: Negative for arthralgias and back pain.  Skin: Negative for color change and rash.  Neurological: Negative for seizures and syncope.  All other systems reviewed and are negative.    Physical Exam Triage Vital Signs ED Triage Vitals  Enc Vitals Group     BP 07/17/17 1033 118/60     Pulse Rate 07/17/17 1033 60     Resp 07/17/17 1033 16     Temp 07/17/17 1033 97.9 F (36.6 C)     Temp Source 07/17/17 1033 Oral     SpO2 07/17/17 1033 100 %     Weight 07/17/17 1034 200 lb (90.7 kg)     Height --      Head Circumference --      Peak Flow --      Pain Score 07/17/17  1034 7     Pain Loc --      Pain Edu? --      Excl. in GC? --    No data found.  Updated Vital Signs BP 118/60 (BP Location: Left Arm)   Pulse 60   Temp 97.9 F (36.6 C) (Oral)   Resp 16   Wt 200 lb (90.7 kg)   LMP 07/09/2017   SpO2 100%   BMI 33.28 kg/m   Visual Acuity Right Eye Distance:   Left Eye Distance:   Bilateral Distance:    Right Eye Near:   Left Eye Near:    Bilateral Near:     Physical Exam  Constitutional: She appears well-developed and well-nourished. No distress.  HENT:  Head: Normocephalic and atraumatic.  Eyes: Conjunctivae are normal.  Neck: Normal range of motion. Neck supple.  Cardiovascular: Normal rate and regular rhythm.  No murmur heard. Pulmonary/Chest: Effort normal and breath sounds normal. No respiratory distress.  Abdominal: Soft. Bowel sounds are normal. There is no tenderness.  Genitourinary: Uterus normal. Vaginal discharge found.  Genitourinary Comments: External genitalia shows labium mAJUS to be slightly swollen, erythematous, with some fissuring in the creases.  Copious white discharge, malodorous from vagina.  Cervix is parous, benign.  Vaginal vault unremarkable.  Cultures obtained.  Musculoskeletal: She exhibits no edema.  Neurological: She is alert.  Skin: Skin is warm and dry.  Psychiatric: She has a normal mood and affect.  Nursing note and vitals reviewed.    UC Treatments / Results  Labs (all labs ordered are listed, but only abnormal results are displayed) Labs Reviewed  CERVICOVAGINAL ANCILLARY ONLY    EKG None  Radiology No results found.  Procedures Procedures (including critical care time)  Medications Ordered in UC Medications - No data to display  Initial Impression / Assessment and Plan / UC Course  I have reviewed the triage vital signs and the nursing notes.  Pertinent labs & imaging results that were available during my care of the patient were reviewed by me and considered in my medical  decision making (see chart for details).     Discussed recurring BV/yeast infections.  Discussed avoiding perfumes and certain hygiene products.  Must maintain acid pH.  Discussed trial of probiotics.  Compliance with treatment is important.  Patient needs to  have a primary care or GYN for follow-up. Final Clinical Impressions(s) / UC Diagnoses   Final diagnoses:  BV (bacterial vaginosis)  Yeast infection involving the vagina and surrounding area     Discharge Instructions     Take the metronidazole for 7 days Start this tomorrow Today and once a week for 4 weeks take a diflucan tab Use the cream in addition Take a probiotic with lactobacillus daily for prevention Follow up with GYN if fails to improve   ED Prescriptions    Medication Sig Dispense Auth. Provider   metroNIDAZOLE (FLAGYL) 500 MG tablet Take 1 tablet (500 mg total) by mouth 2 (two) times daily. 14 tablet Eustace Moore, MD   fluconazole (DIFLUCAN) 150 MG tablet Take one a week for 4 weeks 4 tablet Eustace Moore, MD   ketoconazole (NIZORAL) 2 % cream Apply 1 application topically daily. 15 g Eustace Moore, MD     Controlled Substance Prescriptions [place] Controlled Substance Registry consulted? Not Applicable   Eustace Moore, MD 07/17/17 [phone]

## 2017-07-20 LAB — CERVICOVAGINAL ANCILLARY ONLY
BACTERIAL VAGINITIS: NEGATIVE
CANDIDA VAGINITIS: POSITIVE — AB
Chlamydia: NEGATIVE
Neisseria Gonorrhea: NEGATIVE
Trichomonas: NEGATIVE

## 2017-07-21 ENCOUNTER — Telehealth (HOSPITAL_COMMUNITY): Payer: Self-pay

## 2017-07-21 NOTE — Telephone Encounter (Signed)
Candida (yeast) is positive.  Prescription for fluconazole was given at the urgent care visit.  Pt called and made aware. Recheck or followup with PCP for further evaluation if symptoms are not improving. Answered all questions.  

## 2017-08-22 ENCOUNTER — Encounter (HOSPITAL_COMMUNITY): Payer: Self-pay | Admitting: Emergency Medicine

## 2017-08-22 ENCOUNTER — Other Ambulatory Visit: Payer: Self-pay

## 2017-08-22 ENCOUNTER — Emergency Department (HOSPITAL_COMMUNITY)
Admission: EM | Admit: 2017-08-22 | Discharge: 2017-08-22 | Disposition: A | Discharge status: Home / Self Care | Payer: Self-pay | Attending: Emergency Medicine | Admitting: Emergency Medicine

## 2017-08-22 DIAGNOSIS — Z79899 Other long term (current) drug therapy: Secondary | ICD-10-CM | POA: Insufficient documentation

## 2017-08-22 DIAGNOSIS — J45909 Unspecified asthma, uncomplicated: Secondary | ICD-10-CM | POA: Insufficient documentation

## 2017-08-22 DIAGNOSIS — T7840XA Allergy, unspecified, initial encounter: Secondary | ICD-10-CM | POA: Insufficient documentation

## 2017-08-22 MED ORDER — DIPHENHYDRAMINE HCL 25 MG PO TABS: 25.0000 mg | ORAL_TABLET | Freq: Four times a day (QID) | ORAL | 0 refills | Status: DC

## 2017-08-22 MED ORDER — FAMOTIDINE 20 MG PO TABS: 20.0000 mg | ORAL_TABLET | Freq: Two times a day (BID) | ORAL | 0 refills | Status: DC

## 2017-08-22 MED ORDER — FAMOTIDINE 20 MG PO TABS
20.0000 mg | ORAL_TABLET | Freq: Once | ORAL | Status: DC
  Filled 2017-08-22: qty 1

## 2017-08-22 MED ORDER — DEXAMETHASONE SODIUM PHOSPHATE 10 MG/ML IJ SOLN
10.0000 mg | Freq: Once | INTRAMUSCULAR | Status: AC
  Administered 2017-08-22: 10 mg via INTRAMUSCULAR
  Filled 2017-08-22: qty 1

## 2017-08-22 NOTE — ED Triage Notes (Signed)
Pt states she is having an allergic reaction.  C/o tingling in nose since Friday afternoon.  Reports burning/itching to face since 11pm.  Only known allergy is PCN and she hasn't taken any.  No known cause.  Speaking in complete sentences.  NAD.

## 2017-08-22 NOTE — ED Notes (Signed)
Patient requesting to get discharge paperwork at this time, states she doesn't want the benadryl anymore

## 2017-08-22 NOTE — ED Provider Notes (Signed)
[facility] EMERGENCY DEPARTMENT Provider Note   CSN: [id number] Arrival date & time: 08/22/17  0426     History   Chief Complaint Chief Complaint  Patient presents with  . Allergic Reaction    HPI Taylor Duran is a 32 y.o. female.  HPI Taylor Duran is a 32 y.o. female presents to ED with complaint of face burning. Pt states she noticed "funny" feeling in her nose yesterday evening while driving. Shortly after she developed burning sensation to the face, around 11pm last night. States she washed her face with cool water, but its still burning. Denies trying any medications prior to coming in. No swelling of the tongue, throat. No difficulty breathing.  No rash.  Allergic to penicillins, no other ill allergies.  Denies any new lotions, face washes.  Denies being around fumes or chemicals.  Denies any new foods.  States ate cheetos just before this started, which she normally eats.  Made some chicken earlier. No hx of the same.   Past Medical History:  Diagnosis Date  . Anemia   . Asthma   . Heart murmur     Patient Active Problem List   Diagnosis Date Noted  . IIH (idiopathic intracranial hypertension) 03/11/2017  . Morbid obesity (HCC) 03/11/2017    Past Surgical History:  Procedure Laterality Date  . DILATION AND CURETTAGE OF UTERUS       OB History    Gravida  6   Para      Term      Preterm      AB  2   Living  4     SAB  1   TAB  1   Ectopic      Multiple      Live Births               Home Medications    Prior to Admission medications   Medication Sig Start Date End Date Taking? Authorizing Provider  ferrous sulfate 325 (65 FE) MG EC tablet Take 325 mg by mouth 3 (three) times daily with meals.    [provider]  fluconazole (DIFLUCAN) 150 MG tablet Take one a week for 4 weeks 07/17/17   Eustace Moore, MD  ibuprofen (ADVIL,MOTRIN) 200 MG tablet Take 600 mg by mouth every 6 (six) hours as needed for  headache.    [provider]  ketoconazole (NIZORAL) 2 % cream Apply 1 application topically daily. 07/17/17   Eustace Moore, MD  metroNIDAZOLE (FLAGYL) 500 MG tablet Take 1 tablet (500 mg total) by mouth 2 (two) times daily. 07/17/17   Eustace Moore, MD  Topiramate ER (QUDEXY XR) 100 MG CS24 sprinkle capsule Take 100 mg by mouth at bedtime. 03/11/17   Anson Fret, MD    Family History Family History  Problem Relation Age of Onset  . Migraines Mother   . Stomach cancer Maternal Grandmother   . Colon cancer Maternal Grandfather   . Alzheimer's disease Paternal Grandmother   . Parkinson's disease Paternal Grandmother   . Breast cancer Paternal Aunt   . Breast cancer Paternal Aunt   . Lung cancer Paternal Aunt   . Breast cancer Paternal Aunt     Social History Social History   Tobacco Use  . Smoking status: Never Smoker  . Smokeless tobacco: Never Used  Substance Use Topics  . Alcohol use: No  . Drug use: No     Allergies   Penicillins  Review of Systems Review of Systems  Constitutional: Negative for chills and fever.  HENT: Negative for congestion, facial swelling and mouth sores.   Respiratory: Negative for cough, chest tightness and shortness of breath.   Cardiovascular: Negative for chest pain, palpitations and leg swelling.  Gastrointestinal: Negative for abdominal pain, diarrhea, nausea and vomiting.  Musculoskeletal: Negative for arthralgias, myalgias, neck pain and neck stiffness.  Skin: Negative for color change and rash.  Neurological: Negative for dizziness, weakness and headaches.  All other systems reviewed and are negative.    Physical Exam Updated Vital Signs BP 111/72 (BP Location: Right Arm)   Pulse 69   Temp 98.1 F (36.7 C) (Oral)   Resp 18   LMP 08/06/2017   SpO2 100%   Physical Exam  Constitutional: She appears well-developed and well-nourished. No distress.  HENT:  Head: Normocephalic.  Normal ear canals, TMs  bilaterally.  Normal nasal passages bilaterally.  Normal oropharynx, with no edema, uvula is normal midline.  No rash or lesions over oral mucosa or lips.  Eyes: Conjunctivae are normal.  Neck: Normal range of motion. Neck supple.  Cardiovascular: Normal rate, regular rhythm and normal heart sounds.  Pulmonary/Chest: Effort normal and breath sounds normal. No stridor. No respiratory distress. She has no wheezes. She has no rales.  Musculoskeletal: She exhibits no edema.  Neurological: She is alert.  Skin: Skin is warm and dry.  No rashes noted anywhere in the body.  No rash over oral mucosa.  Psychiatric: She has a normal mood and affect. Her behavior is normal.  Nursing note and vitals reviewed.    ED Treatments / Results  Labs (all labs ordered are listed, but only abnormal results are displayed) Labs Reviewed - No data to display  EKG None  Radiology No results found.  Procedures Procedures (including critical care time)  Medications Ordered in ED Medications  dexamethasone (DECADRON) injection 10 mg (has no administration in time range)  famotidine (PEPCID) tablet 20 mg (has no administration in time range)     Initial Impression / Assessment and Plan / ED Course  I have reviewed the triage vital signs and the nursing notes.  Pertinent labs & imaging results that were available during my care of the patient were reviewed by me and considered in my medical decision making (see chart for details).     Patient with burning sensation to the face.  No rashes noted.  No concerning exam findings.  No stridor or difficulty breathing.  Patient denies any drugs.  No new products.  No new foods.  Patient is sitting in bed and scratching her face.  She drove herself here will drive home.  Will give Decadron and Pepcid here, advised to drive by the pharmacy on the way home and get some Benadryl and take that.  Avoid any products to the face until symptoms improved.  Follow-up with  primary care doctor as needed.  Return precautions discussed.  This time I do not think she needs any further monitoring in emergency department since symptoms have been there for about 8 hours and not worsening.   Vitals:   08/22/17 0439  BP: 111/72  Pulse: 69  Resp: 18  Temp: 98.1 F (36.7 C)  TempSrc: Oral  SpO2: 100%     Final Clinical Impressions(s) / ED Diagnoses   Final diagnoses:  Allergic reaction, initial encounter    ED Discharge Orders        Ordered    diphenhydrAMINE (BENADRYL) 25 MG  tablet  Every 6 hours     08/22/17 0730    famotidine (PEPCID) 20 MG tablet  2 times daily     08/22/17 0730       Jaynie CrumbleKirichenko, Taralynn Quiett, PA-C 08/22/17 0951    Dione BoozeGlick, David, MD 08/23/17 [phone] [id number]

## 2017-08-22 NOTE — Discharge Instructions (Addendum)
Take benadryl as prescribed. Take pepcid as prescribed for itching. You also received steroids which should help with itching. Avoid any personal products, soaps, lotions to the face. Follow up as needed.

## 2017-08-22 NOTE — ED Notes (Signed)
Pat

## 2017-08-22 NOTE — ED Notes (Signed)
Patient requesting benadryl. EDP aware

## 2017-09-28 ENCOUNTER — Encounter (HOSPITAL_COMMUNITY): Payer: Self-pay

## 2017-09-28 ENCOUNTER — Ambulatory Visit (HOSPITAL_COMMUNITY)
Admission: EM | Admit: 2017-09-28 | Discharge: 2017-09-28 | Disposition: A | Discharge status: Home / Self Care | Payer: Self-pay | Attending: Family Medicine | Admitting: Family Medicine

## 2017-09-28 DIAGNOSIS — B373 Candidiasis of vulva and vagina: Secondary | ICD-10-CM | POA: Insufficient documentation

## 2017-09-28 DIAGNOSIS — N898 Other specified noninflammatory disorders of vagina: Secondary | ICD-10-CM | POA: Insufficient documentation

## 2017-09-28 DIAGNOSIS — G932 Benign intracranial hypertension: Secondary | ICD-10-CM | POA: Insufficient documentation

## 2017-09-28 DIAGNOSIS — B372 Candidiasis of skin and nail: Secondary | ICD-10-CM

## 2017-09-28 DIAGNOSIS — Z79899 Other long term (current) drug therapy: Secondary | ICD-10-CM | POA: Insufficient documentation

## 2017-09-28 DIAGNOSIS — J45909 Unspecified asthma, uncomplicated: Secondary | ICD-10-CM | POA: Insufficient documentation

## 2017-09-28 DIAGNOSIS — Z800 Family history of malignant neoplasm of digestive organs: Secondary | ICD-10-CM | POA: Insufficient documentation

## 2017-09-28 LAB — POCT URINALYSIS DIP (DEVICE)
Bilirubin Urine: NEGATIVE
GLUCOSE, UA: NEGATIVE mg/dL
KETONES UR: NEGATIVE mg/dL
Nitrite: NEGATIVE
PROTEIN: NEGATIVE mg/dL
SPECIFIC GRAVITY, URINE: 1.015 (ref 1.005–1.030)
UROBILINOGEN UA: 1 mg/dL (ref 0.0–1.0)
pH: 7 (ref 5.0–8.0)

## 2017-09-28 MED ORDER — FLUCONAZOLE 150 MG PO TABS: ORAL_TABLET | ORAL | 0 refills | Status: DC

## 2017-09-28 NOTE — Discharge Instructions (Signed)
Please take Diflucan once a week for the next 4 weeks.  Please try to keep groin area dry, change underwear frequently, avoid tight clothing  May try over-the-counter clotrimazole cream twice daily to this area to also help.

## 2017-09-28 NOTE — ED Triage Notes (Signed)
Pt presents with vaginal irritation

## 2017-09-28 NOTE — ED Provider Notes (Signed)
[facility]    CSN: [id number] Arrival date & time: 09/28/17  1658     History   Chief Complaint Chief Complaint  Patient presents with  . Vaginal Irritation    HPI Taylor Duran is a 32 y.o. female presenting today for evaluation of rash to vaginal area, states that she has had redness and itching to her groin on both sides.  States that she had a similar issue to this back in May.  She was treated with Diflucan weekly for 1 month and symptoms improved.  Her symptoms began approximately 2 days ago again.  She denies any vaginal discharge.  Denies dysuria, but does note some increased frequency.  Last menstrual period was towards the end of June, and is expecting her cycle soon.  Patient has had her tubes tied.  HPI  Past Medical History:  Diagnosis Date  . Anemia   . Asthma   . Heart murmur     Patient Active Problem List   Diagnosis Date Noted  . IIH (idiopathic intracranial hypertension) 03/11/2017  . Morbid obesity (HCC) 03/11/2017    Past Surgical History:  Procedure Laterality Date  . DILATION AND CURETTAGE OF UTERUS      OB History    Gravida  6   Para      Term      Preterm      AB  2   Living  4     SAB  1   TAB  1   Ectopic      Multiple      Live Births               Home Medications    Prior to Admission medications   Medication Sig Start Date End Date Taking? Authorizing Provider  diphenhydrAMINE (BENADRYL) 25 MG tablet Take 1 tablet (25 mg total) by mouth every 6 (six) hours. 08/22/17   Kirichenko, Tatyana, PA-C  famotidine (PEPCID) 20 MG tablet Take 1 tablet (20 mg total) by mouth 2 (two) times daily. 08/22/17   Kirichenko, Lemont Fillers, PA-C  ferrous sulfate 325 (65 FE) MG EC tablet Take 325 mg by mouth 3 (three) times daily with meals.    [provider]  fluconazole (DIFLUCAN) 150 MG tablet Take one a week for 4 weeks 09/28/17   Tameaka Eichhorn C, PA-C  ibuprofen (ADVIL,MOTRIN) 200 MG tablet Take 600 mg by  mouth every 6 (six) hours as needed for headache.    [provider]  ketoconazole (NIZORAL) 2 % cream Apply 1 application topically daily. 07/17/17   Eustace Moore, MD  Topiramate ER (QUDEXY XR) 100 MG CS24 sprinkle capsule Take 100 mg by mouth at bedtime. 03/11/17   Anson Fret, MD    Family History Family History  Problem Relation Age of Onset  . Migraines Mother   . Stomach cancer Maternal Grandmother   . Colon cancer Maternal Grandfather   . Alzheimer's disease Paternal Grandmother   . Parkinson's disease Paternal Grandmother   . Breast cancer Paternal Aunt   . Breast cancer Paternal Aunt   . Lung cancer Paternal Aunt   . Breast cancer Paternal Aunt     Social History Social History   Tobacco Use  . Smoking status: Never Smoker  . Smokeless tobacco: Never Used  Substance Use Topics  . Alcohol use: No  . Drug use: No     Allergies   Penicillins   Review of Systems Review of Systems  Constitutional:  Negative for fever.  Respiratory: Negative for shortness of breath.   Cardiovascular: Negative for chest pain.  Gastrointestinal: Negative for abdominal pain, diarrhea, nausea and vomiting.  Genitourinary: Positive for frequency. Negative for dysuria, flank pain, genital sores, hematuria, menstrual problem, vaginal bleeding, vaginal discharge and vaginal pain.  Musculoskeletal: Negative for back pain.  Skin: Positive for color change and rash.  Neurological: Negative for dizziness, light-headedness and headaches.     Physical Exam Triage Vital Signs ED Triage Vitals  Enc Vitals Group     BP 09/28/17 1706 118/65     Pulse Rate 09/28/17 1706 75     Resp 09/28/17 1706 18     Temp --      Temp Source 09/28/17 1713 Oral     SpO2 09/28/17 1706 100 %     Weight --      Height --      Head Circumference --      Peak Flow --      Pain Score 09/28/17 1712 0     Pain Loc --      Pain Edu? --      Excl. in GC? --    No data found.  Updated Vital  Signs BP 118/65 (BP Location: Right Arm)   Pulse 75   Resp 20   LMP 09/02/2017   SpO2 100%   Visual Acuity Right Eye Distance:   Left Eye Distance:   Bilateral Distance:    Right Eye Near:   Left Eye Near:    Bilateral Near:     Physical Exam  Constitutional: She is oriented to person, place, and time. She appears well-developed and well-nourished.  No acute distress  HENT:  Head: Normocephalic and atraumatic.  Nose: Nose normal.  Eyes: Conjunctivae are normal.  Neck: Neck supple.  Cardiovascular: Normal rate.  Pulmonary/Chest: Effort normal. No respiratory distress.  Abdominal: She exhibits no distension.  Genitourinary:  Genitourinary Comments: Erythematous rash to bilateral groin areas, extending onto bilateral labia majora, does not appear to involve labia minora and vulva mucosal surfaces, darker discharge present on vaginal exam  Musculoskeletal: Normal range of motion.  Neurological: She is alert and oriented to person, place, and time.  Skin: Skin is warm and dry.  Psychiatric: She has a normal mood and affect.  Nursing note and vitals reviewed.    UC Treatments / Results  Labs (all labs ordered are listed, but only abnormal results are displayed) Labs Reviewed  POCT URINALYSIS DIP (DEVICE) - Abnormal; Notable for the following components:      Result Value   Hgb urine dipstick SMALL (*)    Leukocytes, UA SMALL (*)    All other components within normal limits  URINE CULTURE  CERVICOVAGINAL ANCILLARY ONLY    EKG None  Radiology No results found.  Procedures Procedures (including critical care time)  Medications Ordered in UC Medications - No data to display  Initial Impression / Assessment and Plan / UC Course  I have reviewed the triage vital signs and the nursing notes.  Pertinent labs & imaging results that were available during my care of the patient were reviewed by me and considered in my medical decision making (see chart for  details).     Small leuks on UA, will obtain culture.  Vaginal swab obtained will send off to check for yeast/BV/STDs.  Will provide oral Diflucan to treat yeast infection to groin area, also discussed using clotrimazole cream twice daily.  Discussed keeping it dry. Discussed strict  return precautions. Patient verbalized understanding and is agreeable with plan.  Final Clinical Impressions(s) / UC Diagnoses   Final diagnoses:  Yeast dermatitis     Discharge Instructions     Please take Diflucan once a week for the next 4 weeks.  Please try to keep groin area dry, change underwear frequently, avoid tight clothing  May try over-the-counter clotrimazole cream twice daily to this area to also help.   ED Prescriptions    Medication Sig Dispense Auth. Provider   fluconazole (DIFLUCAN) 150 MG tablet Take one a week for 4 weeks 4 tablet Nakeysha Pasqual C, PA-C     Controlled Substance Prescriptions [place] Controlled Substance Registry consulted? Not Applicable   Lew Dawes, [place] 09/28/17 1740

## 2017-09-29 LAB — CERVICOVAGINAL ANCILLARY ONLY
BACTERIAL VAGINITIS: POSITIVE — AB
CANDIDA VAGINITIS: POSITIVE — AB
Chlamydia: NEGATIVE
NEISSERIA GONORRHEA: NEGATIVE
TRICH (WINDOWPATH): NEGATIVE

## 2017-09-29 LAB — URINE CULTURE

## 2017-10-03 ENCOUNTER — Telehealth (HOSPITAL_COMMUNITY): Payer: Self-pay

## 2017-10-03 MED ORDER — METRONIDAZOLE 500 MG PO TABS: 500.0000 mg | ORAL_TABLET | Freq: Two times a day (BID) | ORAL | 0 refills | Status: DC

## 2017-10-03 NOTE — Telephone Encounter (Signed)
Candida (yeast) is positive.  Prescription for fluconazole was given at the urgent care visit.  Pt called and made aware. Recheck or followup with PCP for further evaluation if symptoms are not improving. Answered all questions.  Bacterial vaginosis is positive. This was not treated at the urgent care visit.  Patient complains of persistent symptoms.  Flagyl 500 mg BID x 7 days #14 no refills sent to patients pharmacy of choice per Dr. Murray.  Pt called and made aware of results and new prescription. Answered all questions and pt verbalized understanding.  

## 2017-12-24 ENCOUNTER — Ambulatory Visit (HOSPITAL_COMMUNITY)
Admission: EM | Admit: 2017-12-24 | Discharge: 2017-12-24 | Disposition: A | Discharge status: Home / Self Care | Payer: Self-pay | Attending: Family Medicine | Admitting: Family Medicine

## 2017-12-24 DIAGNOSIS — N760 Acute vaginitis: Secondary | ICD-10-CM | POA: Insufficient documentation

## 2017-12-24 DIAGNOSIS — Z79899 Other long term (current) drug therapy: Secondary | ICD-10-CM | POA: Insufficient documentation

## 2017-12-24 DIAGNOSIS — Z791 Long term (current) use of non-steroidal anti-inflammatories (NSAID): Secondary | ICD-10-CM | POA: Insufficient documentation

## 2017-12-24 DIAGNOSIS — N898 Other specified noninflammatory disorders of vagina: Secondary | ICD-10-CM

## 2017-12-24 DIAGNOSIS — B9689 Other specified bacterial agents as the cause of diseases classified elsewhere: Secondary | ICD-10-CM | POA: Insufficient documentation

## 2017-12-24 DIAGNOSIS — Z880 Allergy status to penicillin: Secondary | ICD-10-CM | POA: Insufficient documentation

## 2017-12-24 MED ORDER — METRONIDAZOLE 500 MG PO TABS: 500.0000 mg | ORAL_TABLET | Freq: Two times a day (BID) | ORAL | 0 refills | Status: DC

## 2017-12-24 MED ORDER — FLUCONAZOLE 200 MG PO TABS: ORAL_TABLET | ORAL | 0 refills | Status: DC

## 2017-12-24 NOTE — ED Provider Notes (Signed)
[facility]   [id number] 12/24/17 Arrival Time: 1658   [id number] itching  SUBJECTIVE:  Taylor Duran is a 32 y.o. female who presents with complaints of vaginal itching and irritation x 2-3 days.  Has unprotected sex with husband around the time of symptoms.  Denies discharge at this time.  She has tried OTC medications without relief.  Denies worsening symptoms.  She reports similar symptoms in the past and was diagnosed with yeast infection and BV.  She denies fever, chills, nausea, vomiting, abdominal or pelvic pain, urinary symptoms, vaginal odor, vaginal bleeding, dyspareunia, vaginal rashes or lesions.   Patient's last menstrual period was 12/06/2017. Tubal ligation.    ROS: As per HPI.  Past Medical History:  Diagnosis Date  . Anemia   . Asthma   . Heart murmur    Past Surgical History:  Procedure Laterality Date  . DILATION AND CURETTAGE OF UTERUS     Allergies  Allergen Reactions  . Penicillins Itching and Rash    Has patient had a PCN reaction causing immediate rash, facial/tongue/throat swelling, SOB or lightheadedness with hypotension: No Has patient had a PCN reaction causing severe rash involving mucus membranes or skin necrosis: No Has patient had a PCN reaction that required hospitalization: No Has patient had a PCN reaction occurring within the last 10 years: Yes If all of the above answers are "NO", then may proceed with Cephalosporin use.   No current facility-administered medications on file prior to encounter.    Current Outpatient Medications on File Prior to Encounter  Medication Sig Dispense Refill  . diphenhydrAMINE (BENADRYL) 25 MG tablet Take 1 tablet (25 mg total) by mouth every 6 (six) hours. 20 tablet 0  . ferrous sulfate 325 (65 FE) MG EC tablet Take 325 mg by mouth 3 (three) times daily with meals.    Marland Kitchen ibuprofen (ADVIL,MOTRIN) 200 MG tablet Take 600 mg by mouth every 6 (six) hours as needed for headache.    . ketoconazole  (NIZORAL) 2 % cream Apply 1 application topically daily. 15 g 0    Social History   Socioeconomic History  . Marital status: Married    Spouse name: Not on file  . Number of children: 4  . Years of education: college  . Highest education level: Not on file  Occupational History  . Not on file  Social Needs  . Financial resource strain: Not on file  . Food insecurity:    Worry: Not on file    Inability: Not on file  . Transportation needs:    Medical: Not on file    Non-medical: Not on file  Tobacco Use  . Smoking status: Never Smoker  . Smokeless tobacco: Never Used  Substance and Sexual Activity  . Alcohol use: No  . Drug use: No  . Sexual activity: Not on file  Lifestyle  . Physical activity:    Days per week: Not on file    Minutes per session: Not on file  . Stress: Not on file  Relationships  . Social connections:    Talks on phone: Not on file    Gets together: Not on file    Attends religious service: Not on file    Active member of club or organization: Not on file    Attends meetings of clubs or organizations: Not on file    Relationship status: Not on file  . Intimate partner violence:    Fear of current or ex partner: Not on file  Emotionally abused: Not on file    Physically abused: Not on file    Forced sexual activity: Not on file  Other Topics Concern  . Not on file  Social History Narrative   Lives at home with husband and children   Right handed   Drinks about 6 cups of caffeine daily   Family History  Problem Relation Age of Onset  . Migraines Mother   . Stomach cancer Maternal Grandmother   . Colon cancer Maternal Grandfather   . Alzheimer's disease Paternal Grandmother   . Parkinson's disease Paternal Grandmother   . Breast cancer Paternal Aunt   . Breast cancer Paternal Aunt   . Lung cancer Paternal Aunt   . Breast cancer Paternal Aunt     OBJECTIVE:  Vitals:   12/24/17 1744 12/24/17 1745  BP: 125/65   Pulse: 70   Resp: 18    Temp: 98.8 F (37.1 C)   TempSrc: Oral   SpO2: 100%   Weight:  220 lb (99.8 kg)     General appearance: alert, cooperative, appears stated age and no distress Throat: lips, mucosa, and tongue normal; teeth and gums normal Lungs: CTA bilaterally without adventitious breath sounds Heart: regular rate and rhythm.  Radial pulses 2+ symmetrical bilaterally Back: no CVA tenderness Abdomen: soft, non-tender; bowel sounds normal;  no guarding GU: deferred.  Vaginal self swab obtained Skin: warm and dry Psychological:  Alert and cooperative. Normal mood and affect.  ASSESSMENT & PLAN:  1. Vaginal itching   2. Vaginitis and vulvovaginitis     Meds ordered this encounter  Medications  . metroNIDAZOLE (FLAGYL) 500 MG tablet    Sig: Take 1 tablet (500 mg total) by mouth 2 (two) times daily.    Dispense:  14 tablet    Refill:  0    Order Specific Question:   Supervising Provider    Answer:   Isa Rankin [phone]  . fluconazole (DIFLUCAN) 200 MG tablet    Sig: Take 1 tablet by mouth once weekly for the next 4 weeks    Dispense:  4 tablet    Refill:  0    Order Specific Question:   Supervising Provider    Answer:   Isa Rankin [[id number]]    Pending: Labs Reviewed  CERVICOVAGINAL ANCILLARY ONLY    Vaginal self swab obtained Prescribed metronidazole 500 mg twice daily for 7 days (do not take while consuming alcohol) Prescribed diflucan 200 mg take as directed and to completion We will follow up with you regarding the results of your test If tests are positive, please abstain from sexual activity for at least 7 days and notify partners Follow up with PCP if symptoms persists Return here or go to ER if you have any new or worsening symptoms    Reviewed expectations re: course of current medical issues. Questions answered. Outlined signs and symptoms indicating need for more acute intervention. Patient verbalized understanding. After Visit Summary given.         Rennis Harding, PA-C 12/24/17 1902

## 2017-12-24 NOTE — Discharge Instructions (Addendum)
Vaginal self swab obtained Prescribed metronidazole 500 mg twice daily for 7 days (do not take while consuming alcohol) Prescribed diflucan 200 mg take as directed and to completion We will follow up with you regarding the results of your test If tests are positive, please abstain from sexual activity for at least 7 days and notify partners Follow up with PCP if symptoms persists Return here or go to ER if you have any new or worsening symptoms

## 2017-12-24 NOTE — ED Triage Notes (Signed)
Pt states she has a yeast infection  X 2 days

## 2017-12-25 LAB — CERVICOVAGINAL ANCILLARY ONLY
Bacterial vaginitis: NEGATIVE
Candida vaginitis: POSITIVE — AB
Chlamydia: NEGATIVE
Neisseria Gonorrhea: NEGATIVE
Trichomonas: NEGATIVE

## 2018-05-20 ENCOUNTER — Other Ambulatory Visit: Payer: Self-pay

## 2018-05-20 ENCOUNTER — Encounter (HOSPITAL_COMMUNITY): Payer: Self-pay | Admitting: Emergency Medicine

## 2018-05-20 ENCOUNTER — Ambulatory Visit (HOSPITAL_COMMUNITY)
Admission: EM | Admit: 2018-05-20 | Discharge: 2018-05-20 | Disposition: A | Discharge status: Home / Self Care | Payer: Self-pay | Attending: Family Medicine | Admitting: Family Medicine

## 2018-05-20 DIAGNOSIS — N760 Acute vaginitis: Secondary | ICD-10-CM

## 2018-05-20 MED ORDER — NYSTATIN 100000 UNIT/GM EX CREA: TOPICAL_CREAM | CUTANEOUS | 0 refills | Status: DC

## 2018-05-20 MED ORDER — FLUCONAZOLE 150 MG PO TABS: 150.0000 mg | ORAL_TABLET | Freq: Every day | ORAL | 0 refills | Status: DC

## 2018-05-20 MED ORDER — METRONIDAZOLE 500 MG PO TABS: 500.0000 mg | ORAL_TABLET | Freq: Two times a day (BID) | ORAL | 0 refills | Status: DC

## 2018-05-20 MED ORDER — FLUCONAZOLE 200 MG PO TABS: ORAL_TABLET | ORAL | 0 refills | Status: DC

## 2018-05-20 NOTE — Discharge Instructions (Addendum)
We are treating you for yeast and bacterial infection Take the medication as prescribed Follow up as needed for continued or worsening symptoms

## 2018-05-20 NOTE — ED Triage Notes (Signed)
Pt presents to [facility] for assessment of redness and itchiness to vagina and groin area.  States she has tried multiple products without relief and its just getting worse.

## 2018-05-20 NOTE — ED Provider Notes (Signed)
[facility]    CSN: [id number] Arrival date & time: 05/20/18  1100     History   Chief Complaint Chief Complaint  Patient presents with  . Vaginitis    HPI Taylor Duran is a 33 y.o. female.   Patient is a 33 year old female who presents with vaginal itching, irritation, swelling.  This is been present and worsening over the past week.  She has been using multiple over-the-counter treatments without much relief of symptoms.  Denies any significant vaginal discharge . Patient's last menstrual period was 05/05/2018. She is currently sexually active with her husband.  He denies any concern for STDs.  No associated abdominal pain, back pain, pelvic pain, fevers.  ROS per HPI      Past Medical History:  Diagnosis Date  . Anemia   . Asthma   . Heart murmur     Patient Active Problem List   Diagnosis Date Noted  . IIH (idiopathic intracranial hypertension) 03/11/2017  . Morbid obesity (HCC) 03/11/2017    Past Surgical History:  Procedure Laterality Date  . DILATION AND CURETTAGE OF UTERUS      OB History    Gravida  6   Para      Term      Preterm      AB  2   Living  4     SAB  1   TAB  1   Ectopic      Multiple      Live Births               Home Medications    Prior to Admission medications   Medication Sig Start Date End Date Taking? Authorizing Provider  diphenhydrAMINE (BENADRYL) 25 MG tablet Take 1 tablet (25 mg total) by mouth every 6 (six) hours. 08/22/17   Kirichenko, Lemont Fillers, PA-C  ferrous sulfate 325 (65 FE) MG EC tablet Take 325 mg by mouth 3 (three) times daily with meals.    [provider]  fluconazole (DIFLUCAN) 150 MG tablet Take 1 tablet (150 mg total) by mouth daily. 05/20/18   [name], NP  ibuprofen (ADVIL,MOTRIN) 200 MG tablet Take 600 mg by mouth every 6 (six) hours as needed for headache.    [provider]  ketoconazole (NIZORAL) 2 % cream Apply 1 application topically daily.  07/17/17   Eustace Moore, MD  metroNIDAZOLE (FLAGYL) 500 MG tablet Take 1 tablet (500 mg total) by mouth 2 (two) times daily. 05/20/18   [name], NP  nystatin cream (MYCOSTATIN) Apply to affected area 2 times daily 05/20/18   [name], NP    Family History Family History  Problem Relation Age of Onset  . Migraines Mother   . Stomach cancer Maternal Grandmother   . Colon cancer Maternal Grandfather   . Alzheimer's disease Paternal Grandmother   . Parkinson's disease Paternal Grandmother   . Breast cancer Paternal Aunt   . Breast cancer Paternal Aunt   . Lung cancer Paternal Aunt   . Breast cancer Paternal Aunt     Social History Social History   Tobacco Use  . Smoking status: Never Smoker  . Smokeless tobacco: Never Used  Substance Use Topics  . Alcohol use: No  . Drug use: No     Allergies   Penicillins   Review of Systems Review of Systems   Physical Exam Triage Vital Signs ED Triage Vitals  Enc Vitals Group     BP 05/20/18  1131 (!) 120/51     Pulse Rate 05/20/18 1131 68     Resp 05/20/18 1131 18     Temp 05/20/18 1131 97.9 F (36.6 C)     Temp Source 05/20/18 1131 Temporal     SpO2 05/20/18 1131 100 %     Weight --      Height --      Head Circumference --      Peak Flow --      Pain Score 05/20/18 1130 8     Pain Loc --      Pain Edu? --      Excl. in GC? --    No data found.  Updated Vital Signs BP (!) 120/51 (BP Location: Right Arm)   Pulse 68   Temp 97.9 F (36.6 C) (Temporal)   Resp 18   LMP 05/05/2018   SpO2 100%   Visual Acuity Right Eye Distance:   Left Eye Distance:   Bilateral Distance:    Right Eye Near:   Left Eye Near:    Bilateral Near:     Physical Exam Vitals signs and nursing note reviewed.  Constitutional:      General: She is not in acute distress.    Appearance: Normal appearance. She is not ill-appearing, toxic-appearing or diaphoretic.  HENT:     Head: Normocephalic.     Nose: Nose normal.      Mouth/Throat:     Pharynx: Oropharynx is clear.  Eyes:     Conjunctiva/sclera: Conjunctivae normal.  Neck:     Musculoskeletal: Normal range of motion.  Pulmonary:     Effort: Pulmonary effort is normal.  Abdominal:     Palpations: Abdomen is soft.     Tenderness: There is no abdominal tenderness.  Genitourinary:    Comments: Deferred.  Musculoskeletal: Normal range of motion.  Skin:    General: Skin is warm and dry.     Findings: No rash.  Neurological:     Mental Status: She is alert.  Psychiatric:        Mood and Affect: Mood normal.      UC Treatments / Results  Labs (all labs ordered are listed, but only abnormal results are displayed) Labs Reviewed - No data to display  EKG None  Radiology No results found.  Procedures Procedures (including critical care time)  Medications Ordered in UC Medications - No data to display  Initial Impression / Assessment and Plan / UC Course  I have reviewed the triage vital signs and the nursing notes.  Pertinent labs & imaging results that were available during my care of the patient were reviewed by me and considered in my medical decision making (see chart for details).     Symptoms consistent with vaginitis We will treat with Diflucan and Flagyl Patient has history of same We will give her some cream to put on the area to soothe Follow up as needed for continued or worsening symptoms  Final Clinical Impressions(s) / UC Diagnoses   Final diagnoses:  Vaginitis and vulvovaginitis     Discharge Instructions     We are treating you for yeast and bacterial infection Take the medication as prescribed Follow up as needed for continued or worsening symptoms     ED Prescriptions    Medication Sig Dispense Auth. Provider   metroNIDAZOLE (FLAGYL) 500 MG tablet Take 1 tablet (500 mg total) by mouth 2 (two) times daily. 14 tablet [name], NP   fluconazole (DIFLUCAN)  200 MG tablet  (Status: Discontinued) Take 1  tablet by mouth once weekly for the next 4 weeks 4 tablet [name] A, NP   nystatin cream (MYCOSTATIN) Apply to affected area 2 times daily 30 g [name] A, NP   fluconazole (DIFLUCAN) 150 MG tablet Take 1 tablet (150 mg total) by mouth daily. 2 tablet [name], NP     Controlled Substance Prescriptions [place] Controlled Substance Registry consulted? Not Applicable   [name], NP 05/20/18 1157

## 2018-12-01 ENCOUNTER — Ambulatory Visit (HOSPITAL_COMMUNITY)
Admission: EM | Admit: 2018-12-01 | Discharge: 2018-12-01 | Disposition: A | Discharge status: Home / Self Care | Payer: Self-pay | Attending: Family Medicine | Admitting: Family Medicine

## 2018-12-01 ENCOUNTER — Other Ambulatory Visit: Payer: Self-pay

## 2018-12-01 ENCOUNTER — Encounter (HOSPITAL_COMMUNITY): Payer: Self-pay | Admitting: Emergency Medicine

## 2018-12-01 DIAGNOSIS — J029 Acute pharyngitis, unspecified: Secondary | ICD-10-CM | POA: Insufficient documentation

## 2018-12-01 DIAGNOSIS — R591 Generalized enlarged lymph nodes: Secondary | ICD-10-CM | POA: Insufficient documentation

## 2018-12-01 LAB — POCT RAPID STREP A: Streptococcus, Group A Screen (Direct): NEGATIVE

## 2018-12-01 MED ORDER — NAPROXEN 500 MG PO TABS: 500.0000 mg | ORAL_TABLET | Freq: Two times a day (BID) | ORAL | 0 refills | Status: DC

## 2018-12-01 NOTE — Discharge Instructions (Addendum)
For sore throat try using a honey-based tea. Use 3 teaspoons of honey with juice squeezed from half lemon. Place shaved pieces of ginger into 1/2-1 cup of water and warm over stove top. Then mix the ingredients and repeat every 4 hours as needed.  I also recommend trying an oral antihistamine like Zyrtec, Allegra or Claritin to address possible postnasal drainage as a source of your symptoms.  I will let you know about your throat culture once it returns in the next 2 to 4 days.  In the meantime if you develop severe difficulty swallowing, breathing, high fevers then please return to the clinic.

## 2018-12-01 NOTE — ED Provider Notes (Signed)
MRN: [id number] DOB: [birth year]  Subjective:   Taylor Duran is a 33 y.o. female presenting for 2-day history of acute onset persistent worsening throat pain after she ate some walnuts and drink some tea.  Patient has since developed some neck pain around her lymph nodes.  She cannot feel any kind of swelling but is very tender to the touch.  Of note, patient states that her and her husband have already had COVID-19, he was hospitalized in June and both have cleared the infection.    She is not currently taking any medications.    Allergies  Allergen Reactions  . Penicillins Itching and Rash    Has patient had a PCN reaction causing immediate rash, facial/tongue/throat swelling, SOB or lightheadedness with hypotension: No Has patient had a PCN reaction causing severe rash involving mucus membranes or skin necrosis: No Has patient had a PCN reaction that required hospitalization: No Has patient had a PCN reaction occurring within the last 10 years: Yes If all of the above answers are "NO", then may proceed with Cephalosporin use.    Past Medical History:  Diagnosis Date  . Anemia   . Asthma   . Heart murmur      Past Surgical History:  Procedure Laterality Date  . DILATION AND CURETTAGE OF UTERUS    . TUBAL LIGATION      ROS  Objective:   Vitals: BP 120/60 (BP Location: Right Arm)   Pulse 78   Temp 98.6 F (37 C) (Oral) Comment: denies fever, no antipyretics  Resp 18   LMP 11/20/2018   SpO2 (!) 79%   Physical Exam Constitutional:      General: She is not in acute distress.    Appearance: Normal appearance. She is well-developed. She is not ill-appearing.  HENT:     Head: Normocephalic and atraumatic.     Right Ear: Tympanic membrane and ear canal normal. No drainage or tenderness. No middle ear effusion. Tympanic membrane is not erythematous.     Left Ear: Tympanic membrane and ear canal normal. No drainage or tenderness.  No middle ear effusion. Tympanic  membrane is not erythematous.     Nose: Nose normal. No congestion or rhinorrhea.     Mouth/Throat:     Mouth: Mucous membranes are moist. No oral lesions.     Pharynx: Oropharynx is clear. Posterior oropharyngeal erythema (mild, bilateral) present. No pharyngeal swelling, oropharyngeal exudate or uvula swelling.     Tonsils: No tonsillar exudate or tonsillar abscesses.  Eyes:     General: No scleral icterus.    Extraocular Movements: Extraocular movements intact.     Right eye: Normal extraocular motion.     Left eye: Normal extraocular motion.     Conjunctiva/sclera: Conjunctivae normal.     Pupils: Pupils are equal, round, and reactive to light.  Neck:     Musculoskeletal: Normal range of motion and neck supple.  Cardiovascular:     Rate and Rhythm: Normal rate.  Pulmonary:     Effort: Pulmonary effort is normal.  Lymphadenopathy:     Cervical: Cervical adenopathy (bilateral over cervical lymph nodes, superior) present.  Skin:    General: Skin is warm and dry.  Neurological:     General: No focal deficit present.     Mental Status: She is alert and oriented to person, place, and time.  Psychiatric:        Mood and Affect: Mood normal.        Behavior: Behavior normal.  Results for orders placed or performed during the hospital encounter of 12/01/18 (from the past 24 hour(s))  POCT rapid strep A [facility])     Status: None   Collection Time: 12/01/18  7:18 PM  Result Value Ref Range   Streptococcus, Group A Screen (Direct) NEGATIVE NEGATIVE    Assessment and Plan :   1. Sore throat   2. Lymphadenopathy     We will manage for viral illness for now.  Patient reports previous COVID infection and therefore we did not test for this.  Strep culture is pending.  Timing for mono is not appropriate for testing.  Naproxen for pain and inflammation, cervical lymphadenopathy.  Use supportive care otherwise. Counseled patient on potential for adverse effects with  medications prescribed/recommended today, ER and return-to-clinic precautions discussed, patient verbalized understanding.    Jaynee Eagles, [place] 12/01/18 1933

## 2018-12-01 NOTE — ED Triage Notes (Signed)
11/29/2018 ate walnuts and drank tea, one hour later throat started hurting and has hurt ever since.    Denies cough, denies fever, denies runny.

## 2018-12-04 LAB — CULTURE, GROUP A STREP (THRC)

## 2019-04-14 NOTE — Progress Notes (Signed)
Virtual Visit via Video Note The purpose of this virtual visit is to provide medical care while limiting exposure to the novel coronavirus.    Consent was obtained for video visit:  Yes.   Answered questions that patient had about telehealth interaction:  Yes.   I discussed the limitations, risks, security and privacy concerns of performing an evaluation and management service by telemedicine. I also discussed with the patient that there may be a patient responsible charge related to this service. The patient expressed understanding and agreed to proceed.  Pt location: Home Physician Location: office Name of referring provider:  Richardean Chimera, MD I connected with Taylor Duran at patients initiation/request on 04/18/2019 at  9:10 AM EST by video enabled telemedicine application and verified that I am speaking with the correct person using two identifiers. Pt MRN:  [id number] Pt DOB:  04-11-1985 Video Participants:  Taylor Duran   History of Present Illness:  Taylor Duran is a 34 year old female who presents for migraines.  History supplemented by prior neurologist's and referring provider's notes.  In 2018, she was diagnosed with idiopathic intracranial hypertension.  She developed stabbing pain in her left eye that was aggravated with eye movement.  She also noted intermittent left worse than right blurred vision.  On eye exam that December, she was noted to have left greater than right papilledema.  She was evaluated by neurologist, Dr. Naomie Dean.  She underwent a lumbar puncture that demonstrated an opening pressure of 33.  She did not take acetazolamide or have recommended MRI/MRV of head because she said at the time that she could not afford it.  Headaches improved after the LP.  However, they have gotten worse over the past 6 months.  They are severe, lasting all day and occur 2 to 3 times a week.  They are associated with nausea, vomiting, photophobia and phonophobia.  She  hasn't seen the eye doctor.    Current NSAIDS:  none Current analgesics:  none Current triptans:  none Current ergotamine:  none Current anti-emetic:  none Current muscle relaxants:  none Current anti-anxiolytic:  none Current sleep aide:  none Current Antihypertensive medications:  none Current Antidepressant medications:  none Current Anticonvulsant medications:  none Current anti-CGRP:  none Current Vitamins/Herbal/Supplements:  none Current Antihistamines/Decongestants:  none Other therapy:  none Hormone/birth control:  none  Past NSAIDS:  Naproxen; ibuprofen Past analgesics:  Fioricet; Extra-strength Tylenol Past abortive triptans:  none Past abortive ergotamine:  none Past muscle relaxants:  Flexeril 10mg  Past anti-emetic:  none Past antihypertensive medications:  none Past antidepressant medications:  nonoe Past anticonvulsant medications:  Topiramate ER 100mg ; acetazolamide Past anti-CGRP:  none Past vitamins/Herbal/Supplements:  none Past antihistamines/decongestants:  Zyrtec; Benadryl Other past therapies:  none    Past Medical History: Past Medical History:  Diagnosis Date  . Anemia   . Asthma   . Heart murmur     Medications: Outpatient Encounter Medications as of 04/18/2019  Medication Sig  . naproxen (NAPROSYN) 500 MG tablet Take 1 tablet (500 mg total) by mouth 2 (two) times daily.  . [DISCONTINUED] diphenhydrAMINE (BENADRYL) 25 MG tablet Take 1 tablet (25 mg total) by mouth every 6 (six) hours.  . [DISCONTINUED] ferrous sulfate 325 (65 FE) MG EC tablet Take 325 mg by mouth 3 (three) times daily with meals.   No facility-administered encounter medications on file as of 04/18/2019.    Allergies: Allergies  Allergen Reactions  . Penicillins Itching and Rash  Has patient had a PCN reaction causing immediate rash, facial/tongue/throat swelling, SOB or lightheadedness with hypotension: No Has patient had a PCN reaction causing severe rash involving  mucus membranes or skin necrosis: No Has patient had a PCN reaction that required hospitalization: No Has patient had a PCN reaction occurring within the last 10 years: Yes If all of the above answers are "NO", then may proceed with Cephalosporin use.    Family History: Family History  Problem Relation Age of Onset  . Migraines Mother   . Stomach cancer Maternal Grandmother   . Colon cancer Maternal Grandfather   . Alzheimer's disease Paternal Grandmother   . Parkinson's disease Paternal Grandmother   . Breast cancer Paternal Aunt   . Breast cancer Paternal Aunt   . Lung cancer Paternal Aunt   . Breast cancer Paternal Aunt     Social History: Social History   Socioeconomic History  . Marital status: Married    Spouse name: Not on file  . Number of children: 4  . Years of education: college  . Highest education level: Not on file  Occupational History  . Not on file  Tobacco Use  . Smoking status: Never Smoker  . Smokeless tobacco: Never Used  Substance and Sexual Activity  . Alcohol use: No  . Drug use: No  . Sexual activity: Not on file  Other Topics Concern  . Not on file  Social History Narrative   Lives at home with husband and children   Right handed   Drinks about 6 cups of caffeine daily   Social Determinants of Health   Financial Resource Strain:   . Difficulty of Paying Living Expenses: Not on file  Food Insecurity:   . Worried About Charity fundraiser in the Last Year: Not on file  . Ran Out of Food in the Last Year: Not on file  Transportation Needs:   . Lack of Transportation (Medical): Not on file  . Lack of Transportation (Non-Medical): Not on file  Physical Activity:   . Days of Exercise per Week: Not on file  . Minutes of Exercise per Session: Not on file  Stress:   . Feeling of Stress : Not on file  Social Connections:   . Frequency of Communication with Friends and Family: Not on file  . Frequency of Social Gatherings with Friends and  Family: Not on file  . Attends Religious Services: Not on file  . Active Member of Clubs or Organizations: Not on file  . Attends Archivist Meetings: Not on file  . Marital Status: Not on file  Intimate Partner Violence:   . Fear of Current or Ex-Partner: Not on file  . Emotionally Abused: Not on file  . Physically Abused: Not on file  . Sexually Abused: Not on file    Observations/Objective:   Height 5\' 6"  (1.676 m), weight 220 lb (99.8 kg). No acute distress.  Alert and oriented.  Speech fluent and not dysarthric.  Language intact.  .  Assessment and Plan:   1.  Intracranial hypertension 2.  Migraine without aura, without status migrainosus, not intractable  She wanted to get the MRI first before starting any medication.  I also recommended getting a repeat eye exam.  I recommended starting acetazolamide.  I explained that possible side effect may be paresthesias.  She stated that she did not want to take a medication that may cause paresthesias.  I explained that the medications that are used to treat  intracranial pressure, topiramate and acetazolamide, may cause paresthesias.  She stated that the other neurologist told her that there are many medications available.  I stated that while there are many medications used to treat migraines, topiramate or acetazolamide would be used to treat the increased intracranial pressure.  She asked if the eye exam showed evidence of increased intracranial pressure, would I order her another LP.  I explained that we typically wouldn't do that as we already know she has increased intracranial pressure and would first need to treat with medication.  She did not appear to be happy with this plan.  She subsequently hung up on me.  Due to her displeasure with my plan, I recommend that she seek management from another neurologist. Due to her hostility towards me (including hanging up on me), she will be discharged from the practice.  Follow Up  Instructions:    -I discussed the assessment and treatment plan with the patient. The patient was provided an opportunity to ask questions and all were answered. The patient agreed with the plan and demonstrated an understanding of the instructions.   The patient was advised to call back or seek an in-person evaluation if the symptoms worsen or if the condition fails to improve as anticipated.   Cira Servant, DO

## 2019-04-18 ENCOUNTER — Other Ambulatory Visit: Payer: Self-pay

## 2019-04-18 ENCOUNTER — Encounter: Payer: Self-pay | Admitting: Neurology

## 2019-04-18 ENCOUNTER — Telehealth (INDEPENDENT_AMBULATORY_CARE_PROVIDER_SITE_OTHER): Payer: Medicaid Other | Admitting: Neurology

## 2019-04-18 VITALS — Ht 66.0 in | Wt 220.0 lb

## 2019-04-18 DIAGNOSIS — G932 Benign intracranial hypertension: Secondary | ICD-10-CM

## 2019-04-18 DIAGNOSIS — G43009 Migraine without aura, not intractable, without status migrainosus: Secondary | ICD-10-CM | POA: Diagnosis not present

## 2019-04-21 ENCOUNTER — Telehealth: Payer: Self-pay | Admitting: Neurology

## 2019-04-21 NOTE — Telephone Encounter (Signed)
Patient dismissed from [facility] by Dr. Shon Millet, effective 04/18/19. Dismissal letter sent out by 1st class [place] mail 04/21/19. fbg

## 2019-08-19 ENCOUNTER — Ambulatory Visit (HOSPITAL_COMMUNITY)
Admission: EM | Admit: 2019-08-19 | Discharge: 2019-08-19 | Disposition: A | Discharge status: Home / Self Care | Payer: Medicaid Other | Attending: Physician Assistant | Admitting: Physician Assistant

## 2019-08-19 ENCOUNTER — Encounter (HOSPITAL_COMMUNITY): Payer: Self-pay | Admitting: Emergency Medicine

## 2019-08-19 ENCOUNTER — Other Ambulatory Visit: Payer: Self-pay

## 2019-08-19 DIAGNOSIS — Z23 Encounter for immunization: Secondary | ICD-10-CM

## 2019-08-19 DIAGNOSIS — S61214A Laceration without foreign body of right ring finger without damage to nail, initial encounter: Secondary | ICD-10-CM

## 2019-08-19 MED ORDER — CEPHALEXIN 500 MG PO CAPS: 500.0000 mg | ORAL_CAPSULE | Freq: Four times a day (QID) | ORAL | 0 refills | Status: DC

## 2019-08-19 MED ORDER — TETANUS-DIPHTH-ACELL PERTUSSIS 5-2.5-18.5 LF-MCG/0.5 IM SUSP
0.5000 mL | Freq: Once | INTRAMUSCULAR | Status: AC
  Administered 2019-08-19: 0.5 mL via INTRAMUSCULAR

## 2019-08-19 MED ORDER — TETANUS-DIPHTH-ACELL PERTUSSIS 5-2.5-18.5 LF-MCG/0.5 IM SUSP
INTRAMUSCULAR | Status: AC
  Filled 2019-08-19: qty 0.5

## 2019-08-19 NOTE — ED Notes (Signed)
I applied dry dressing to pt's wound. 

## 2019-08-19 NOTE — ED Triage Notes (Signed)
Pt c/o left hand ring finger laceration today. Pt states she was using a meat grinder at work and sliced her finger.

## 2019-08-19 NOTE — ED Triage Notes (Signed)
Pt wound was cleaned and pressure wrapped upon arrival.

## 2019-08-19 NOTE — Discharge Instructions (Signed)
Keep finger clean, dry, and covered for the next 48 hours. After 48 hours may remove dressing and may clean with warm water and mild soap.  Do not apply hydrogen peroxide or rubbing alcohol to wound. Keep clean and covered while at work. Do not submerge wound in water.

## 2019-08-19 NOTE — ED Provider Notes (Addendum)
[facility]    CSN: [id number] Arrival date & time: 08/19/19  1000      History   Chief Complaint Chief Complaint  Patient presents with  . Finger Injury    HPI Taylor Duran is a 34 y.o. female.   Patient here c/w laceration R ring finger x 1 hour ago.  She is RHD. 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  Last tetanus 13 years ago, will update today.  Denies n/t, weakness, RROM.       Past Medical History:  Diagnosis Date  . Anemia   . Asthma   . Heart murmur     Patient Active Problem List   Diagnosis Date Noted  . IIH (idiopathic intracranial hypertension) 03/11/2017  . Morbid obesity (HCC) 03/11/2017    Past Surgical History:  Procedure Laterality Date  . DILATION AND CURETTAGE OF UTERUS    . TUBAL LIGATION      OB History    Gravida  6   Para      Term      Preterm      AB  2   Living  4     SAB  1   TAB  1   Ectopic      Multiple      Live Births               Home Medications    Prior to Admission medications   Medication Sig Start Date End Date Taking? Authorizing Provider  cephALEXin (KEFLEX) 500 MG capsule Take 1 capsule (500 mg total) by mouth 4 (four) times daily. 08/19/19   Evern Core, PA-C  naproxen (NAPROSYN) 500 MG tablet Take 1 tablet (500 mg total) by mouth 2 (two) times daily. Patient not taking: Reported on 04/18/2019 12/01/18   Wallis Bamberg, PA-C  diphenhydrAMINE (BENADRYL) 25 MG tablet Take 1 tablet (25 mg total) by mouth every 6 (six) hours. 08/22/17 12/01/18  Kirichenko, Lemont Fillers, PA-C  ferrous sulfate 325 (65 FE) MG EC tablet Take 325 mg by mouth 3 (three) times daily with meals.  12/01/18  [provider]    Family History Family History  Problem Relation Age of Onset  . Migraines Mother   . Stomach cancer Maternal Grandmother   . Colon cancer Maternal Grandfather   . Alzheimer's disease Paternal Grandmother   .  Parkinson's disease Paternal Grandmother   . Breast cancer Paternal Aunt   . Breast cancer Paternal Aunt   . Lung cancer Paternal Aunt   . Breast cancer Paternal Aunt     Social History Social History   Tobacco Use  . Smoking status: Never Smoker  . Smokeless tobacco: Never Used  Vaping Use  . Vaping Use: Never used  Substance Use Topics  . Alcohol use: No  . Drug use: No     Allergies   Shellfish allergy and Penicillins   Review of Systems Review of Systems  Gastrointestinal: Negative for nausea and vomiting.  Musculoskeletal: Negative for arthralgias, joint swelling and myalgias.  Skin: Positive for wound. Negative for color change.  Neurological: Negative for weakness and numbness.  Hematological: Negative for adenopathy. Does not bruise/bleed easily.  Psychiatric/Behavioral: Negative for self-injury.     Physical Exam Triage Vital Signs ED Triage Vitals  Enc Vitals Group     BP 08/19/19 1027 (!) 115/55     Pulse Rate 08/19/19 1027 65     Resp --  Temp 08/19/19 1027 98.3 F (36.8 C)     Temp Source 08/19/19 1027 Oral     SpO2 08/19/19 1027 100 %     Weight --      Height --      Head Circumference --      Peak Flow --      Pain Score 08/19/19 1029 6     Pain Loc --      Pain Edu? --      Excl. in [place]? --    No data found.  Updated Vital Signs BP (!) 115/55 (BP Location: Right Arm)   Pulse 65   Temp 98.3 F (36.8 C) (Oral)   LMP 08/09/2019   SpO2 100%   Visual Acuity Right Eye Distance:   Left Eye Distance:   Bilateral Distance:    Right Eye Near:   Left Eye Near:    Bilateral Near:     Physical Exam Vitals and nursing note reviewed.  Constitutional:      General: She is not in acute distress.    Appearance: She is well-developed. She is not toxic-appearing.  HENT:     Head: Normocephalic and atraumatic.     Nose: Nose normal.  Eyes:     General: No scleral icterus.    Conjunctiva/sclera: Conjunctivae normal.  Pulmonary:      Effort: Pulmonary effort is normal. No respiratory distress.  Musculoskeletal:     Right hand: Laceration (2 cm laceration R ring finger, ulnar aspect, flap) and tenderness present. No swelling, deformity or bony tenderness. Normal range of motion. Normal strength. Normal sensation. There is no disruption of two-point discrimination. Normal capillary refill. Normal pulse.     Cervical back: Normal range of motion and neck supple.  Skin:    General: Skin is warm and dry.     Capillary Refill: Capillary refill takes less than 2 seconds.  Neurological:     General: No focal deficit present.     Mental Status: She is alert and oriented to person, place, and time.  Psychiatric:        Mood and Affect: Mood normal.        Behavior: Behavior normal.      UC Treatments / Results  Labs (all labs ordered are listed, but only abnormal results are displayed) Labs Reviewed - No data to display  EKG   Radiology No results found.  Procedures Laceration Repair  Date/Time: 08/19/2019 11:35 AM Performed by: Peri Jefferson, PA-C Authorized by: Peri Jefferson, PA-C   Consent:    Consent obtained:  Verbal   Consent given by:  Patient   Risks discussed:  Infection, poor cosmetic result and poor wound healing   Alternatives discussed:  No treatment and referral Anesthesia (see MAR for exact dosages):    Anesthesia method:  Local infiltration   Local anesthetic:  Lidocaine 1% w/o epi Laceration details:    Location:  Finger   Finger location:  R ring finger   Length (cm):  2 Repair type:    Repair type:  Simple Pre-procedure details:    Preparation:  Patient was prepped and draped in usual sterile fashion Exploration:    Hemostasis achieved with:  Direct pressure   Wound exploration: wound explored through full range of motion and entire depth of wound probed and visualized     Wound extent: areolar tissue violated     Wound extent: no fascia violation noted, no foreign  bodies/material noted, no muscle damage noted, no nerve  damage noted and no tendon damage noted     Contaminated: no   Treatment:    Area cleansed with:  Betadine   Amount of cleaning:  Extensive   Irrigation solution:  Sterile saline   Irrigation volume:  30   Irrigation method:  Syringe Skin repair:    Repair method:  Sutures   Suture size:  3-0   Suture material:  Nylon   Suture technique:  Simple interrupted   Number of sutures:  4 Approximation:    Approximation:  Close Post-procedure details:    Dressing:  Antibiotic ointment and sterile dressing   Patient tolerance of procedure:  Tolerated well, no immediate complications   (including critical care time)  Medications Ordered in UC Medications  Tdap (BOOSTRIX) injection 0.5 mL (0.5 mLs Intramuscular Given 08/19/19 1129)    Initial Impression / Assessment and Plan / UC Course  I have reviewed the triage vital signs and the nursing notes.  Pertinent labs & imaging results that were available during my care of the patient were reviewed by me and considered in my medical decision making (see chart for details).     Keep finger clean, dry, and covered for the next 48 hours. After 48 hours may remove dressing and may clean with warm water and mild soap.  Do not apply hydrogen peroxide or rubbing alcohol to wound. Keep clean and covered while at work. Do not submerge wound in water. Final Clinical Impressions(s) / UC Diagnoses   Final diagnoses:  Laceration of right ring finger without foreign body without damage to nail, initial encounter     Discharge Instructions     Keep finger clean, dry, and covered for the next 48 hours. After 48 hours may remove dressing and may clean with warm water and mild soap.  Do not apply hydrogen peroxide or rubbing alcohol to wound. Keep clean and covered while at work. Do not submerge wound in water.    ED Prescriptions    Medication Sig Dispense Auth. Provider   cephALEXin  (KEFLEX) 500 MG capsule Take 1 capsule (500 mg total) by mouth 4 (four) times daily. 28 capsule Evern Core, PA-C     PDMP not reviewed this encounter.   Evern Core, PA-C 08/19/19 1134    Evern Core, PA-C 08/19/19 1137

## 2021-10-02 ENCOUNTER — Ambulatory Visit (HOSPITAL_COMMUNITY)
Admission: EM | Admit: 2021-10-02 | Discharge: 2021-10-02 | Disposition: A | Discharge status: Home / Self Care | Payer: Medicaid Other | Attending: Emergency Medicine | Admitting: Emergency Medicine

## 2021-10-02 ENCOUNTER — Encounter (HOSPITAL_COMMUNITY): Payer: Self-pay | Admitting: *Deleted

## 2021-10-02 DIAGNOSIS — J069 Acute upper respiratory infection, unspecified: Secondary | ICD-10-CM | POA: Diagnosis not present

## 2021-10-02 DIAGNOSIS — Z20822 Contact with and (suspected) exposure to covid-19: Secondary | ICD-10-CM | POA: Diagnosis present

## 2021-10-02 MED ORDER — CYCLOBENZAPRINE HCL 5 MG PO TABS: 5.0000 mg | ORAL_TABLET | Freq: Three times a day (TID) | ORAL | 0 refills | Status: DC | PRN

## 2021-10-02 MED ORDER — BENZONATATE 100 MG PO CAPS: 100.0000 mg | ORAL_CAPSULE | Freq: Three times a day (TID) | ORAL | 0 refills | Status: DC

## 2021-10-02 MED ORDER — ACETAMINOPHEN 325 MG PO TABS
ORAL_TABLET | ORAL | Status: AC
  Filled 2021-10-02: qty 1

## 2021-10-02 MED ORDER — KETOROLAC TROMETHAMINE 30 MG/ML IJ SOLN
INTRAMUSCULAR | Status: AC
  Filled 2021-10-02: qty 1

## 2021-10-02 MED ORDER — ACETAMINOPHEN 325 MG PO TABS
ORAL_TABLET | ORAL | Status: AC
  Filled 2021-10-02: qty 2

## 2021-10-02 MED ORDER — PROMETHAZINE-DM 6.25-15 MG/5ML PO SYRP: 5.0000 mL | ORAL_SOLUTION | Freq: Four times a day (QID) | ORAL | 0 refills | Status: DC | PRN

## 2021-10-02 MED ORDER — KETOROLAC TROMETHAMINE 30 MG/ML IJ SOLN
30.0000 mg | Freq: Once | INTRAMUSCULAR | Status: AC
Start: 2021-10-02 — End: 2021-10-02
  Administered 2021-10-02: 30 mg via INTRAMUSCULAR

## 2021-10-02 MED ORDER — ACETAMINOPHEN 325 MG PO TABS
650.0000 mg | ORAL_TABLET | Freq: Once | ORAL | Status: AC
Start: 2021-10-02 — End: 2021-10-02
  Administered 2021-10-02: 650 mg via ORAL

## 2021-10-02 NOTE — ED Triage Notes (Signed)
C/O feeling feverish and having chills with significant body aches today. C/O cough and nasal congestion. Has not taken any meds.

## 2021-10-02 NOTE — ED Provider Notes (Signed)
[facility]    CSN: [id number] Arrival date & time: 10/02/21  1753      History   Chief Complaint Chief Complaint  Patient presents with   Fever   Generalized Body Aches    HPI Taylor Duran is a 36 y.o. female.   Patient presents with fevers, body aches, nasal congestion, rhinorrhea, intermittent productive cough, generalized headaches and diarrhea beginning today approximately around 12 PM.  Decreased appetite but tolerating fluids.  No known sick contacts.  Treatment of symptoms.  History of asthma.  Denies shortness of breath, wheezing, ear pain, sore throat, abdominal pain, nausea or vomiting.    Past Medical History:  Diagnosis Date   Anemia    Asthma    Heart murmur     Patient Active Problem List   Diagnosis Date Noted   IIH (idiopathic intracranial hypertension) 03/11/2017   Morbid obesity (HCC) 03/11/2017    Past Surgical History:  Procedure Laterality Date   DILATION AND CURETTAGE OF UTERUS     TUBAL LIGATION      OB History     Gravida  6   Para      Term      Preterm      AB  2   Living  4      SAB  1   IAB  1   Ectopic      Multiple      Live Births               Home Medications    Prior to Admission medications   Medication Sig Start Date End Date Taking? Authorizing Provider  cephALEXin (KEFLEX) 500 MG capsule Take 1 capsule (500 mg total) by mouth 4 (four) times daily. 08/19/19   Evern Core, PA-C  naproxen (NAPROSYN) 500 MG tablet Take 1 tablet (500 mg total) by mouth 2 (two) times daily. Patient not taking: Reported on 04/18/2019 12/01/18   Wallis Bamberg, PA-C  diphenhydrAMINE (BENADRYL) 25 MG tablet Take 1 tablet (25 mg total) by mouth every 6 (six) hours. 08/22/17 12/01/18  Kirichenko, Lemont Fillers, PA-C  ferrous sulfate 325 (65 FE) MG EC tablet Take 325 mg by mouth 3 (three) times daily with meals.  12/01/18  [provider]    Family History Family History  Problem Relation Age of Onset    Migraines Mother    Stomach cancer Maternal Grandmother    Colon cancer Maternal Grandfather    Alzheimer's disease Paternal Grandmother    Parkinson's disease Paternal Grandmother    Breast cancer Paternal Aunt    Breast cancer Paternal Aunt    Lung cancer Paternal Aunt    Breast cancer Paternal Aunt     Social History Social History   Tobacco Use   Smoking status: Never   Smokeless tobacco: Never  Vaping Use   Vaping Use: Never used  Substance Use Topics   Alcohol use: No   Drug use: No     Allergies   Shellfish allergy and Penicillins   Review of Systems Review of Systems  Constitutional:  Positive for fever. Negative for activity change, appetite change, chills, diaphoresis, fatigue and unexpected weight change.  HENT:  Positive for congestion and rhinorrhea. Negative for dental problem, drooling, ear discharge, ear pain, facial swelling, hearing loss, mouth sores, nosebleeds, postnasal drip, sinus pressure, sinus pain, sneezing, sore throat, tinnitus, trouble swallowing and voice change.   Respiratory:  Positive for cough. Negative for apnea, choking, chest tightness, shortness of breath,  wheezing and stridor.   Cardiovascular: Negative.   Gastrointestinal:  Positive for diarrhea. Negative for abdominal distention, abdominal pain, anal bleeding, blood in stool, constipation, nausea, rectal pain and vomiting.  Skin: Negative.   Neurological:  Positive for headaches. Negative for dizziness, tremors, seizures, syncope, facial asymmetry, speech difficulty, weakness, light-headedness and numbness.     Physical Exam Triage Vital Signs ED Triage Vitals  Enc Vitals Group     BP 10/02/21 1800 (!) 143/84     Pulse Rate 10/02/21 1800 91     Resp 10/02/21 1800 16     Temp 10/02/21 1800 99.1 F (37.3 C)     Temp Source 10/02/21 1800 Oral     SpO2 10/02/21 1800 96 %     Weight --      Height --      Head Circumference --      Peak Flow --      Pain Score 10/02/21 1802 8      Pain Loc --      Pain Edu? --      Excl. in [place]? --    No data found.  Updated Vital Signs BP (!) 143/84   Pulse 91   Temp 99.1 F (37.3 C) (Oral)   Resp 16   LMP 09/24/2021 (Exact Date)   SpO2 96%   Visual Acuity Right Eye Distance:   Left Eye Distance:   Bilateral Distance:    Right Eye Near:   Left Eye Near:    Bilateral Near:     Physical Exam Constitutional:      Appearance: Normal appearance.  HENT:     Head: Normocephalic.     Right Ear: Tympanic membrane, ear canal and external ear normal.     Left Ear: Tympanic membrane, ear canal and external ear normal.     Nose: Congestion present. No rhinorrhea.     Mouth/Throat:     Mouth: Mucous membranes are moist.     Pharynx: Posterior oropharyngeal erythema present.  Eyes:     Extraocular Movements: Extraocular movements intact.  Cardiovascular:     Rate and Rhythm: Normal rate and regular rhythm.     Pulses: Normal pulses.     Heart sounds: Normal heart sounds.  Pulmonary:     Effort: Pulmonary effort is normal.     Breath sounds: Normal breath sounds.  Musculoskeletal:     Cervical back: Normal range of motion and neck supple.  Skin:    General: Skin is warm and dry.  Neurological:     Mental Status: She is alert and oriented to person, place, and time. Mental status is at baseline.  Psychiatric:        Mood and Affect: Mood normal.        Behavior: Behavior normal.      UC Treatments / Results  Labs (all labs ordered are listed, but only abnormal results are displayed) Labs Reviewed  SARS CORONAVIRUS 2 (TAT 6-24 HRS)    EKG   Radiology No results found.  Procedures Procedures (including critical care time)  Medications Ordered in UC Medications  acetaminophen (TYLENOL) tablet 650 mg (650 mg Oral Given 10/02/21 1807)    Initial Impression / Assessment and Plan / UC Course  I have reviewed the triage vital signs and the nursing notes.  Pertinent labs & imaging results that were  available during my care of the patient were reviewed by me and considered in my medical decision making (see chart for details).  URI with cough  Vital signs are stable, low-grade fever of 99.1 noted in triage, patient is tearful due to the amount of body aches that she is experiencing but in no signs of distress, oral Tylenol and Toradol injection given in office , COVID test is pending, discussed quarantine if positive, if positive may use antivirals due to history of asthma, and Flexeril prescribed for outpatient management, also prescribed Tessalon and Promethazine DM for management of cough, may use additional over-the-counter medication for supportive care, work note given Final Clinical Impressions(s) / UC Diagnoses   Final diagnoses:  Encounter for laboratory testing for COVID-19 virus   Discharge Instructions   None    ED Prescriptions   None    PDMP not reviewed this encounter.   Hans Eden, [place] 10/02/21 [phone]

## 2021-10-02 NOTE — Discharge Instructions (Signed)
Your symptoms today are most likely being caused by a virus and should steadily improve in time it can take up to 7 to 10 days before you truly start to see a turnaround however things will get better  You may use muscle relaxant every 8 hours as needed to help with body aches, be mindful this will make you drowsy  You may use Tessalon pill every 8 hours to help calm your coughing  You may use cough syrup every 6 hours for additional comfort, be mindful this medication may also make you drowsy    You can take Tylenol and/or Ibuprofen as needed for fever reduction and pain relief.   For cough: honey 1/2 to 1 teaspoon (you can dilute the honey in water or another fluid).  You can also use guaifenesin and dextromethorphan for cough. You can use a humidifier for chest congestion and cough.  If you don't have a humidifier, you can sit in the bathroom with the hot shower running.      For sore throat: try warm salt water gargles, cepacol lozenges, throat spray, warm tea or water with lemon/honey, popsicles or ice, or OTC cold relief medicine for throat discomfort.   For congestion: take a daily anti-histamine like Zyrtec, Claritin, and a oral decongestant, such as pseudoephedrine.  You can also use Flonase 1-2 sprays in each nostril daily.   It is important to stay hydrated: drink plenty of fluids (water, gatorade/powerade/pedialyte, juices, or teas) to keep your throat moisturized and help further relieve irritation/discomfort.

## 2021-10-03 ENCOUNTER — Telehealth (HOSPITAL_COMMUNITY): Payer: Self-pay | Admitting: Emergency Medicine

## 2021-10-03 LAB — SARS CORONAVIRUS 2 (TAT 6-24 HRS): SARS Coronavirus 2: POSITIVE — AB

## 2021-10-03 MED ORDER — MOLNUPIRAVIR EUA 200MG CAPSULE: 4.0000 | ORAL_CAPSULE | Freq: Two times a day (BID) | ORAL | 0 refills | Status: DC

## 2021-10-03 MED ORDER — MOLNUPIRAVIR EUA 200MG CAPSULE: 4.0000 | ORAL_CAPSULE | Freq: Two times a day (BID) | ORAL | 0 refills | Status: AC

## 2022-01-19 ENCOUNTER — Ambulatory Visit (HOSPITAL_COMMUNITY)
Admission: EM | Admit: 2022-01-19 | Discharge: 2022-01-19 | Disposition: A | Discharge status: Home / Self Care | Payer: Medicaid Other | Attending: Emergency Medicine | Admitting: Emergency Medicine

## 2022-01-19 ENCOUNTER — Encounter (HOSPITAL_COMMUNITY): Payer: Self-pay

## 2022-01-19 DIAGNOSIS — J069 Acute upper respiratory infection, unspecified: Secondary | ICD-10-CM

## 2022-01-19 MED ORDER — PREDNISONE 20 MG PO TABS: 40.0000 mg | ORAL_TABLET | Freq: Every day | ORAL | 0 refills | Status: DC

## 2022-01-19 MED ORDER — BENZONATATE 100 MG PO CAPS: 100.0000 mg | ORAL_CAPSULE | Freq: Three times a day (TID) | ORAL | 0 refills | Status: DC

## 2022-01-19 MED ORDER — PROMETHAZINE-DM 6.25-15 MG/5ML PO SYRP: 5.0000 mL | ORAL_SOLUTION | Freq: Four times a day (QID) | ORAL | 0 refills | Status: DC | PRN

## 2022-01-19 NOTE — ED Notes (Signed)
Discharged by Dee, CMA.  

## 2022-01-19 NOTE — ED Provider Notes (Signed)
[facility]    CSN: [id number] Arrival date & time: 01/19/22  1426      History   Chief Complaint Chief Complaint  Patient presents with   Cough   Fever    HPI Taylor Duran is a 36 y.o. female.   Patient presents with objective fever, sore throat , nasal congestion, rhinorrhea and a harsh cough causing wheezing for 1 day.  Known sick contact in household.  Has attempted use of Mucinex DM the day and night as well as Ricola cough drops without improvement.  Tolerating food and liquids.  Denies shortness of breath, chest pain or tightness.   Past Medical History:  Diagnosis Date   Anemia    Asthma    Heart murmur     Patient Active Problem List   Diagnosis Date Noted   IIH (idiopathic intracranial hypertension) 03/11/2017   Morbid obesity (HCC) 03/11/2017    Past Surgical History:  Procedure Laterality Date   DILATION AND CURETTAGE OF UTERUS     TUBAL LIGATION      OB History     Gravida  6   Para      Term      Preterm      AB  2   Living  4      SAB  1   IAB  1   Ectopic      Multiple      Live Births               Home Medications    Prior to Admission medications   Medication Sig Start Date End Date Taking? Authorizing Provider  benzonatate (TESSALON) 100 MG capsule Take 1 capsule (100 mg total) by mouth every 8 (eight) hours. 10/02/21   Tiffony Kite, Elita Boone, NP  cephALEXin (KEFLEX) 500 MG capsule Take 1 capsule (500 mg total) by mouth 4 (four) times daily. 08/19/19   Evern Core, PA-C  cyclobenzaprine (FLEXERIL) 5 MG tablet Take 1 tablet (5 mg total) by mouth 3 (three) times daily as needed for muscle spasms. 10/02/21   Yunuen Mordan, Elita Boone, NP  naproxen (NAPROSYN) 500 MG tablet Take 1 tablet (500 mg total) by mouth 2 (two) times daily. Patient not taking: Reported on 04/18/2019 12/01/18   Wallis Bamberg, PA-C  promethazine-dextromethorphan (PROMETHAZINE-DM) 6.25-15 MG/5ML syrup Take 5 mLs by mouth 4 (four) times daily as needed  for cough. 10/02/21   Valinda Hoar, NP  diphenhydrAMINE (BENADRYL) 25 MG tablet Take 1 tablet (25 mg total) by mouth every 6 (six) hours. 08/22/17 12/01/18  Kirichenko, Lemont Fillers, PA-C  ferrous sulfate 325 (65 FE) MG EC tablet Take 325 mg by mouth 3 (three) times daily with meals.  12/01/18  [provider]    Family History Family History  Problem Relation Age of Onset   Migraines Mother    Stomach cancer Maternal Grandmother    Colon cancer Maternal Grandfather    Alzheimer's disease Paternal Grandmother    Parkinson's disease Paternal Grandmother    Breast cancer Paternal Aunt    Breast cancer Paternal Aunt    Lung cancer Paternal Aunt    Breast cancer Paternal Aunt     Social History Social History   Tobacco Use   Smoking status: Never   Smokeless tobacco: Never  Vaping Use   Vaping Use: Never used  Substance Use Topics   Alcohol use: No   Drug use: No     Allergies   Shellfish allergy and Penicillins  Review of Systems Review of Systems  Constitutional:  Positive for fever. Negative for activity change, appetite change, chills, diaphoresis, fatigue and unexpected weight change.  HENT:  Positive for congestion, rhinorrhea and sore throat. Negative for dental problem, drooling, ear discharge, ear pain, facial swelling, hearing loss, mouth sores, nosebleeds, postnasal drip, sinus pressure, sinus pain, sneezing, tinnitus, trouble swallowing and voice change.   Respiratory:  Positive for cough and wheezing. Negative for apnea, choking, chest tightness, shortness of breath and stridor.   Cardiovascular: Negative.   Gastrointestinal: Negative.      Physical Exam Triage Vital Signs ED Triage Vitals  Enc Vitals Group     BP 01/19/22 1516 125/81     Pulse Rate 01/19/22 1516 89     Resp 01/19/22 1516 16     Temp 01/19/22 1516 98.3 F (36.8 C)     Temp src --      SpO2 01/19/22 1516 98 %     Weight --      Height --      Head Circumference --      Peak  Flow --      Pain Score 01/19/22 1514 4     Pain Loc --      Pain Edu? --      Excl. in [place]? --    No data found.  Updated Vital Signs BP 125/81 (BP Location: Left Arm)   Pulse 89   Temp 98.3 F (36.8 C)   Resp 16   LMP 12/20/2021 (Approximate)   SpO2 98%   Visual Acuity Right Eye Distance:   Left Eye Distance:   Bilateral Distance:    Right Eye Near:   Left Eye Near:    Bilateral Near:     Physical Exam Constitutional:      Appearance: Normal appearance.  HENT:     Head: Normocephalic.     Right Ear: Tympanic membrane, ear canal and external ear normal.     Left Ear: Tympanic membrane, ear canal and external ear normal. There is no impacted cerumen.     Nose: Congestion and rhinorrhea present.     Mouth/Throat:     Mouth: Mucous membranes are moist.     Pharynx: Oropharynx is clear.  Eyes:     Extraocular Movements: Extraocular movements intact.  Cardiovascular:     Rate and Rhythm: Normal rate and regular rhythm.     Pulses: Normal pulses.     Heart sounds: Normal heart sounds.  Pulmonary:     Effort: Pulmonary effort is normal.     Breath sounds: Normal breath sounds.     Comments: Wheezing present only with coughing Neurological:     Mental Status: She is alert and oriented to person, place, and time. Mental status is at baseline.  Psychiatric:        Mood and Affect: Mood normal.        Behavior: Behavior normal.      UC Treatments / Results  Labs (all labs ordered are listed, but only abnormal results are displayed) Labs Reviewed - No data to display  EKG   Radiology No results found.  Procedures Procedures (including critical care time)  Medications Ordered in UC Medications - No data to display  Initial Impression / Assessment and Plan / UC Course  I have reviewed the triage vital signs and the nursing notes.  Pertinent labs & imaging results that were available during my care of the patient were reviewed by me and considered  in my  medical decision making (see chart for details).  Viral URI with cough  Patient is in no signs of distress nor toxic appearing.  Vital signs are stable.  Low suspicion for pneumonia, pneumothorax or bronchitis and therefore will defer imaging.  Prescribed prednisone, Tessalon, Promethazine DM  May use additional over-the-counter medications as needed for supportive care.  May follow-up with urgent care as needed if symptoms persist or worsen.   Final Clinical Impressions(s) / UC Diagnoses   Final diagnoses:  None   Discharge Instructions   None    ED Prescriptions   None    PDMP not reviewed this encounter.   Valinda Hoar, NP 01/19/22 1541

## 2022-01-19 NOTE — Discharge Instructions (Signed)
Your symptoms today are most likely being caused by a virus and should steadily improve in time it can take up to 7 to 10 days before you truly start to see a turnaround however things will get better  Start prednisone every morning with food for 5 days, this reduces inflammation to the airways which will calm your coughing as well as your wheezing  You may use Tessalon pill every 8 hours to help calm your coughing  May use cough syrup every 6 hours as needed for additional comfort, be off of this may make you drowsy    You can take Tylenol and/or Ibuprofen as needed for fever reduction and pain relief.   For cough: honey 1/2 to 1 teaspoon (you can dilute the honey in water or another fluid).  You can also use guaifenesin and dextromethorphan for cough. You can use a humidifier for chest congestion and cough.  If you don't have a humidifier, you can sit in the bathroom with the hot shower running.      For sore throat: try warm salt water gargles, cepacol lozenges, throat spray, warm tea or water with lemon/honey, popsicles or ice, or OTC cold relief medicine for throat discomfort.   For congestion: take a daily anti-histamine like Zyrtec, Claritin, and a oral decongestant, such as pseudoephedrine.  You can also use Flonase 1-2 sprays in each nostril daily.   It is important to stay hydrated: drink plenty of fluids (water, gatorade/powerade/pedialyte, juices, or teas) to keep your throat moisturized and help further relieve irritation/discomfort.

## 2022-01-19 NOTE — ED Triage Notes (Signed)
Cough fever, runny nose onset yesterday. States her daughter was sick first but not tested for anything.   Taking Mucinex and ricola cough medication OTC with no relief.

## 2022-01-24 ENCOUNTER — Emergency Department (HOSPITAL_COMMUNITY)
Admission: EM | Admit: 2022-01-24 | Discharge: 2022-01-24 | Disposition: A | Discharge status: Home / Self Care | Payer: Medicaid Other | Attending: Emergency Medicine | Admitting: Emergency Medicine

## 2022-01-24 ENCOUNTER — Other Ambulatory Visit: Payer: Self-pay

## 2022-01-24 ENCOUNTER — Encounter (HOSPITAL_COMMUNITY): Payer: Self-pay

## 2022-01-24 ENCOUNTER — Emergency Department (HOSPITAL_COMMUNITY): Payer: Medicaid Other

## 2022-01-24 DIAGNOSIS — E876 Hypokalemia: Secondary | ICD-10-CM | POA: Diagnosis not present

## 2022-01-24 DIAGNOSIS — K047 Periapical abscess without sinus: Secondary | ICD-10-CM | POA: Diagnosis not present

## 2022-01-24 DIAGNOSIS — R220 Localized swelling, mass and lump, head: Secondary | ICD-10-CM | POA: Diagnosis present

## 2022-01-24 LAB — CBC WITH DIFFERENTIAL/PLATELET
Abs Immature Granulocytes: 0.02 10*3/uL (ref 0.00–0.07)
Basophils Absolute: 0.1 10*3/uL (ref 0.0–0.1)
Basophils Relative: 1 %
Eosinophils Absolute: 0.1 10*3/uL (ref 0.0–0.5)
Eosinophils Relative: 1 %
HCT: 27.9 % — ABNORMAL LOW (ref 36.0–46.0)
Hemoglobin: 7.9 g/dL — ABNORMAL LOW (ref 12.0–15.0)
Immature Granulocytes: 0 %
Lymphocytes Relative: 15 %
Lymphs Abs: 0.9 10*3/uL (ref 0.7–4.0)
MCH: 20.7 pg — ABNORMAL LOW (ref 26.0–34.0)
MCHC: 28.3 g/dL — ABNORMAL LOW (ref 30.0–36.0)
MCV: 73 fL — ABNORMAL LOW (ref 80.0–100.0)
Monocytes Absolute: 0.9 10*3/uL (ref 0.1–1.0)
Monocytes Relative: 16 %
Neutro Abs: 3.7 10*3/uL (ref 1.7–7.7)
Neutrophils Relative %: 67 %
Platelets: 265 10*3/uL (ref 150–400)
RBC: 3.82 MIL/uL — ABNORMAL LOW (ref 3.87–5.11)
RDW: 19 % — ABNORMAL HIGH (ref 11.5–15.5)
WBC: 5.6 10*3/uL (ref 4.0–10.5)
nRBC: 0 % (ref 0.0–0.2)

## 2022-01-24 LAB — BASIC METABOLIC PANEL
Anion gap: 7 (ref 5–15)
BUN: <5 mg/dL — ABNORMAL LOW (ref 6–20)
CO2: 25 mmol/L (ref 22–32)
Calcium: 8.4 mg/dL — ABNORMAL LOW (ref 8.9–10.3)
Chloride: 104 mmol/L (ref 98–111)
Creatinine, Ser: 0.52 mg/dL (ref 0.44–1.00)
GFR, Estimated: >60 mL/min (ref >60)
Glucose, Bld: 97 mg/dL (ref 70–99)
Potassium: 3.1 mmol/L — ABNORMAL LOW (ref 3.5–5.1)
Sodium: 136 mmol/L (ref 135–145)

## 2022-01-24 LAB — I-STAT BETA HCG BLOOD, ED (MC, WL, AP ONLY): I-stat hCG, quantitative: <5 m[IU]/mL (ref <5)

## 2022-01-24 MED ORDER — ACETAMINOPHEN 500 MG PO TABS
1000.0000 mg | ORAL_TABLET | Freq: Once | ORAL | Status: AC
  Administered 2022-01-24: 1000 mg via ORAL
  Filled 2022-01-24: qty 2

## 2022-01-24 MED ORDER — CLINDAMYCIN HCL 300 MG PO CAPS: 300.0000 mg | ORAL_CAPSULE | Freq: Three times a day (TID) | ORAL | 0 refills | Status: DC

## 2022-01-24 MED ORDER — IOHEXOL 350 MG/ML SOLN
75.0000 mL | Freq: Once | INTRAVENOUS | Status: AC | PRN
  Administered 2022-01-24: 75 mL via INTRAVENOUS

## 2022-01-24 MED ORDER — CLINDAMYCIN HCL 150 MG PO CAPS
300.0000 mg | ORAL_CAPSULE | Freq: Once | ORAL | Status: AC
  Administered 2022-01-24: 300 mg via ORAL
  Filled 2022-01-24: qty 2

## 2022-01-24 MED ORDER — POTASSIUM CHLORIDE CRYS ER 20 MEQ PO TBCR
40.0000 meq | EXTENDED_RELEASE_TABLET | Freq: Once | ORAL | Status: AC
  Administered 2022-01-24: 40 meq via ORAL
  Filled 2022-01-24: qty 2

## 2022-01-24 NOTE — ED Triage Notes (Signed)
Pt arrived POV from home c/o facial swelling that she noticed since yesterday. Pt states she is unsure if it is related to tessalon and prednisone that she started taking a week ago.

## 2022-01-24 NOTE — ED Notes (Signed)
Discharge instructions reviewed with patient. Patient denies any questions or concerns. Patient ambulatory out of ED. 

## 2022-01-24 NOTE — Discharge Instructions (Addendum)
It was our pleasure to provide your ER care today - we hope that you feel better.  Take antibiotic (clindamycin) as prescribed.   Take acetaminophen or ibuprofen as need for pain.  Follow up with dentist in the next 1-2 days - call office now/today for appointment.   From today's labs your potassium level is low - eat plenty of fruits and vegetables, and follow up with primary care doctor in one week.   Return to ER if worse, new symptoms, trouble breathing or swallowing, increased swelling/spreading redness, severe pain, severe headache, or other concern.

## 2022-01-24 NOTE — ED Provider Notes (Signed)
[facility] EMERGENCY DEPARTMENT Provider Note   CSN: [id number] Arrival date & time: 01/24/22  1217     History  Chief Complaint  Patient presents with   Facial Swelling    Taylor Duran is a 36 y.o. female.  Pt with right facial swelling and right sided tooth pain in the past 1-2 days. Symptoms acute onset, moderate, persistent. Also notes recent uri symptoms w non prod cough, congestion. Temp 100.3, denies chills/sweats. No trouble breathing or swallowing. No headache. No eye pain. No ear pain. No other area of swelling or rash.   The history is provided by the patient and medical records.       Home Medications Prior to Admission medications   Medication Sig Start Date End Date Taking? Authorizing Provider  benzonatate (TESSALON) 100 MG capsule Take 1 capsule (100 mg total) by mouth every 8 (eight) hours. 01/19/22   White, Elita Boone, NP  cephALEXin (KEFLEX) 500 MG capsule Take 1 capsule (500 mg total) by mouth 4 (four) times daily. 08/19/19   Evern Core, PA-C  cyclobenzaprine (FLEXERIL) 5 MG tablet Take 1 tablet (5 mg total) by mouth 3 (three) times daily as needed for muscle spasms. 10/02/21   White, Elita Boone, NP  naproxen (NAPROSYN) 500 MG tablet Take 1 tablet (500 mg total) by mouth 2 (two) times daily. Patient not taking: Reported on 04/18/2019 12/01/18   Wallis Bamberg, PA-C  predniSONE (DELTASONE) 20 MG tablet Take 2 tablets (40 mg total) by mouth daily. 01/19/22   White, Elita Boone, NP  promethazine-dextromethorphan (PROMETHAZINE-DM) 6.25-15 MG/5ML syrup Take 5 mLs by mouth 4 (four) times daily as needed for cough. 01/19/22   White, Elita Boone, NP  diphenhydrAMINE (BENADRYL) 25 MG tablet Take 1 tablet (25 mg total) by mouth every 6 (six) hours. 08/22/17 12/01/18  Kirichenko, Lemont Fillers, PA-C  ferrous sulfate 325 (65 FE) MG EC tablet Take 325 mg by mouth 3 (three) times daily with meals.  12/01/18  [provider]      Allergies    Shellfish  allergy and Penicillins    Review of Systems   Review of Systems  Constitutional:  Negative for chills.  HENT:  Positive for dental problem. Negative for sore throat and trouble swallowing.   Eyes:  Negative for pain and redness.  Respiratory:  Positive for cough. Negative for shortness of breath.   Cardiovascular:  Negative for chest pain.  Gastrointestinal:  Negative for abdominal pain and vomiting.  Genitourinary:  Negative for flank pain.  Musculoskeletal:  Negative for neck pain and neck stiffness.  Skin:  Negative for rash.  Neurological:  Negative for headaches.  Hematological:  Does not bruise/bleed easily.  Psychiatric/Behavioral:  Negative for confusion.     Physical Exam Updated Vital Signs BP 138/73 (BP Location: Right Arm)   Pulse 80   Temp 100.3 F (37.9 C) (Oral)   Resp (!) 22   Ht 1.676 m (5\' 6" )   Wt 91.2 kg   LMP 12/20/2021 (Approximate)   SpO2 100%   BMI 32.44 kg/m  Physical Exam Vitals and nursing note reviewed.  Constitutional:      Appearance: Normal appearance. She is well-developed.  HENT:     Head: Atraumatic.     Nose: Nose normal.     Mouth/Throat:     Mouth: Mucous membranes are moist.     Pharynx: Oropharynx is clear.     Comments: Right upper molar dental pain/tenderness, mild associated gum swelling, no fluctuance/abscess noted.  No trismus. Pharynx appears normal, no erythema or swelling noted. No pain, swelling or tenderness to floor of mouth or neck. Mild right facial/cheek area swelling. No fluctuance. No parotid pain/tenderness.  Eyes:     General: No scleral icterus.    Conjunctiva/sclera: Conjunctivae normal.     Pupils: Pupils are equal, round, and reactive to light.  Neck:     Trachea: No tracheal deviation.     Comments: Trachea midline.  Cardiovascular:     Rate and Rhythm: Normal rate and regular rhythm.     Pulses: Normal pulses.     Heart sounds: Normal heart sounds. No murmur heard.    No friction rub. No gallop.   Pulmonary:     Effort: Pulmonary effort is normal. No respiratory distress.     Breath sounds: Normal breath sounds. No stridor.  Musculoskeletal:        General: No swelling or tenderness.     Cervical back: Normal range of motion and neck supple. No rigidity. No muscular tenderness.  Lymphadenopathy:     Cervical: No cervical adenopathy.  Skin:    General: Skin is warm and dry.     Findings: No rash.  Neurological:     Mental Status: She is alert.     Comments: Alert, speech normal.   Psychiatric:        Mood and Affect: Mood normal.     ED Results / Procedures / Treatments   Labs (all labs ordered are listed, but only abnormal results are displayed) Results for orders placed or performed during the hospital encounter of 01/24/22  CBC with Differential  Result Value Ref Range   WBC 5.6 4.0 - 10.5 K/uL   RBC 3.82 (L) 3.87 - 5.11 MIL/uL   Hemoglobin 7.9 (L) 12.0 - 15.0 g/dL   HCT 37.1 (L) 69.6 - 78.9 %   MCV 73.0 (L) 80.0 - 100.0 fL   MCH 20.7 (L) 26.0 - 34.0 pg   MCHC 28.3 (L) 30.0 - 36.0 g/dL   RDW 38.1 (H) 01.7 - 51.0 %   Platelets 265 150 - 400 K/uL   nRBC 0.0 0.0 - 0.2 %   Neutrophils Relative % 67 %   Neutro Abs 3.7 1.7 - 7.7 K/uL   Lymphocytes Relative 15 %   Lymphs Abs 0.9 0.7 - 4.0 K/uL   Monocytes Relative 16 %   Monocytes Absolute 0.9 0.1 - 1.0 K/uL   Eosinophils Relative 1 %   Eosinophils Absolute 0.1 0.0 - 0.5 K/uL   Basophils Relative 1 %   Basophils Absolute 0.1 0.0 - 0.1 K/uL   Immature Granulocytes 0 %   Abs Immature Granulocytes 0.02 0.00 - 0.07 K/uL  Basic metabolic panel  Result Value Ref Range   Sodium 136 135 - 145 mmol/L   Potassium 3.1 (L) 3.5 - 5.1 mmol/L   Chloride 104 98 - 111 mmol/L   CO2 25 22 - 32 mmol/L   Glucose, Bld 97 70 - 99 mg/dL   BUN <5 (L) 6 - 20 mg/dL   Creatinine, Ser 2.58 0.44 - 1.00 mg/dL   Calcium 8.4 (L) 8.9 - 10.3 mg/dL   GFR, Estimated >52 >77 mL/min   Anion gap 7 5 - 15  I-Stat Beta hCG blood, ED ([facility], [facility], [facility]  only)  Result Value Ref Range   I-stat hCG, quantitative <5.0 <5 mIU/mL   Comment 3              EKG None  Radiology  CT Maxillofacial W Contrast  Result Date: 01/24/2022 CLINICAL DATA:  Parotid region mass, facial swelling. EXAM: CT MAXILLOFACIAL WITH CONTRAST TECHNIQUE: Multidetector CT imaging of the maxillofacial structures was performed with intravenous contrast. Multiplanar CT image reconstructions were also generated. RADIATION DOSE REDUCTION: This exam was performed according to the departmental dose-optimization program which includes automated exposure control, adjustment of the mA and/or kV according to patient size and/or use of iterative reconstruction technique. CONTRAST:  31mL OMNIPAQUE IOHEXOL 350 MG/ML SOLN COMPARISON:  CT head 09/03/2016 FINDINGS: Osseous: There is no acute osseous abnormality. There is no suspicious osseous lesion. There is a small periapical lucency around the root of the right maxillary second molar with a defect in the overlying maxillary cortex (5-42). There is also a carious lesion of the left maxillary second molar with mild periapical lucency. Orbits: The globes and orbits are unremarkable. Sinuses: There is moderate mucosal thickening in the right maxillary sinus and mild mucosal thickening in the left maxillary sinus with aerated secretions. Soft tissues: There is right facial soft tissue swelling and fat stranding. There is no abscess. The parotid and submandibular glands are unremarkable. Limited intracranial: The imaged portions of the intracranial compartment are unremarkable. IMPRESSION: 1. Right facial soft tissue swelling suggesting cellulitis, suspected odontogenic in nature related to the right maxillary second molar. 2. Moderate right worse than left maxillary sinus mucosal thickening with aerated secretions. Findings may reflect acute sinusitis in the correct clinical setting. Electronically Signed   By: Lesia Hausen M.D.   On: 01/24/2022 15:04     Procedures Procedures    Medications Ordered in ED Medications  potassium chloride SA (KLOR-CON M) CR tablet 40 mEq (has no administration in time range)  clindamycin (CLEOCIN) capsule 300 mg (has no administration in time range)  acetaminophen (TYLENOL) tablet 1,000 mg (1,000 mg Oral Given 01/24/22 1302)    ED Course/ Medical Decision Making/ A&P                           Medical Decision Making Problems Addressed: Dental abscess: acute illness or injury with systemic symptoms Facial swelling: acute illness or injury with systemic symptoms Hypokalemia: acute illness or injury  Amount and/or Complexity of Data Reviewed External Data Reviewed: notes. Labs: ordered. Decision-making details documented in ED Course. Radiology: ordered and independent interpretation performed. Decision-making details documented in ED Course.  Risk OTC drugs. Prescription drug management.   Labs sent from triage.   Reviewed nursing notes and prior charts for additional history.   Labs reviewed/interpreted by me - wbc normal. Chronic anemia. K low. Kcl po.   Imaging reviewed/interpreted by me - dental abscess.  Overall pt is well, not toxic appearing. No trouble breathing or swallowing.   Clindamycin po, rx for home (pcn allergy).   Rec close dental f/u.  Return precautions provided.           Final Clinical Impression(s) / ED Diagnoses Final diagnoses:  None    Rx / DC Orders ED Discharge Orders     None         Cathren Laine, MD 01/24/22 1526

## 2022-01-24 NOTE — ED Provider Triage Note (Signed)
Emergency Medicine Provider Triage Evaluation Note  Taylor Duran , a 36 y.o. female  was evaluated in triage.  Pt complains of right sided facial swelling and pain that started yesterday.  She was seen at [facility] for URI and given prednisone and Tessalon Perles on 11/12.  She also states the inside of her mouth hurts on the right side too.  Denies shortness of breath, difficulty swallowing, voice changes.  Review of Systems  Positive: See above Negative: See above  Physical Exam  BP 138/73 (BP Location: Right Arm)   Pulse 80   Temp 100.3 F (37.9 C) (Oral)   Resp (!) 22   Ht 5\' 6"  (1.676 m)   Wt 91.2 kg   LMP 12/20/2021 (Approximate)   SpO2 100%   BMI 32.44 kg/m  Gen:   Awake, no distress   Resp:  Normal effort  MSK:   Moves extremities without difficulty  Other:  Unilateral facial swelling on the right side with tenderness to palpation over the parotid gland region; no submandibular, submental, or postauricular lymphadenopathy or tenderness  Medical Decision Making  Medically screening exam initiated at 12:51 PM.  Appropriate orders placed.  Taylor Duran was informed that the remainder of the evaluation will be completed by another provider, this initial triage assessment does not replace that evaluation, and the importance of remaining in the ED until their evaluation is complete.     Silvio Clayman R, Melton Alar 01/24/22 1254

## 2023-01-02 ENCOUNTER — Ambulatory Visit (HOSPITAL_COMMUNITY)
Admission: EM | Admit: 2023-01-02 | Discharge: 2023-01-02 | Disposition: A | Discharge status: Home / Self Care | Payer: Medicaid Other | Attending: Family Medicine | Admitting: Family Medicine

## 2023-01-02 ENCOUNTER — Other Ambulatory Visit: Payer: Self-pay

## 2023-01-02 ENCOUNTER — Encounter (HOSPITAL_COMMUNITY): Payer: Self-pay | Admitting: *Deleted

## 2023-01-02 DIAGNOSIS — K047 Periapical abscess without sinus: Secondary | ICD-10-CM

## 2023-01-02 MED ORDER — KETOROLAC TROMETHAMINE 30 MG/ML IJ SOLN
INTRAMUSCULAR | Status: AC
  Filled 2023-01-02: qty 1

## 2023-01-02 MED ORDER — CLINDAMYCIN HCL 300 MG PO CAPS
300.0000 mg | ORAL_CAPSULE | Freq: Three times a day (TID) | ORAL | 0 refills | Status: AC
Start: 2023-01-02 — End: 2023-01-09

## 2023-01-02 MED ORDER — KETOROLAC TROMETHAMINE 30 MG/ML IJ SOLN
30.0000 mg | Freq: Once | INTRAMUSCULAR | Status: AC
Start: 2023-01-02 — End: 2023-01-02
  Administered 2023-01-02: 30 mg via INTRAMUSCULAR

## 2023-01-02 MED ORDER — KETOROLAC TROMETHAMINE 10 MG PO TABS: 10.0000 mg | ORAL_TABLET | Freq: Four times a day (QID) | ORAL | 0 refills | Status: DC | PRN

## 2023-01-02 NOTE — ED Triage Notes (Signed)
Pt reports she woke up today with Rt facial swelling. Pt denies any dental pain.

## 2023-01-02 NOTE — ED Provider Notes (Signed)
[facility]    CSN: [id number] Arrival date & time: 01/02/23  1234      History   Chief Complaint Chief Complaint  Patient presents with   Facial Swelling    HPI Taylor Duran is a 37 y.o. female.   HPI Here for right sided facial pain and tooth pain and swelling.   she first noted it this morning.  She is taken some Tylenol which is not helping    She is allergic to penicillin which causes itching and hives.  On review of the chart she is tolerated clinda in the past  Last menstrual cycle was October 19.   Past Medical History:  Diagnosis Date   Anemia    Asthma    Heart murmur     Patient Active Problem List   Diagnosis Date Noted   IIH (idiopathic intracranial hypertension) 03/11/2017   Morbid obesity (HCC) 03/11/2017    Past Surgical History:  Procedure Laterality Date   DILATION AND CURETTAGE OF UTERUS     TUBAL LIGATION      OB History     Gravida  6   Para      Term      Preterm      AB  2   Living  4      SAB  1   IAB  1   Ectopic      Multiple      Live Births               Home Medications    Prior to Admission medications   Medication Sig Start Date End Date Taking? Authorizing Provider  clindamycin (CLEOCIN) 300 MG capsule Take 1 capsule (300 mg total) by mouth 3 (three) times daily for 7 days. 01/02/23 01/09/23 Yes Zenia Resides, MD  ketorolac (TORADOL) 10 MG tablet Take 1 tablet (10 mg total) by mouth every 6 (six) hours as needed (pain). 01/02/23  Yes Zenia Resides, MD  diphenhydrAMINE (BENADRYL) 25 MG tablet Take 1 tablet (25 mg total) by mouth every 6 (six) hours. 08/22/17 12/01/18  Kirichenko, Lemont Fillers, PA-C  ferrous sulfate 325 (65 FE) MG EC tablet Take 325 mg by mouth 3 (three) times daily with meals.  12/01/18  [provider]    Family History Family History  Problem Relation Age of Onset   Migraines Mother    Stomach cancer Maternal Grandmother    Colon cancer Maternal  Grandfather    Alzheimer's disease Paternal Grandmother    Parkinson's disease Paternal Grandmother    Breast cancer Paternal Aunt    Breast cancer Paternal Aunt    Lung cancer Paternal Aunt    Breast cancer Paternal Aunt     Social History Social History   Tobacco Use   Smoking status: Never   Smokeless tobacco: Never  Vaping Use   Vaping status: Never Used  Substance Use Topics   Alcohol use: No   Drug use: No     Allergies   Shellfish allergy and Penicillins   Review of Systems Review of Systems   Physical Exam Triage Vital Signs ED Triage Vitals  Encounter Vitals Group     BP 01/02/23 1316 112/67     Systolic BP Percentile --      Diastolic BP Percentile --      Pulse Rate 01/02/23 1316 77     Resp 01/02/23 1316 20     Temp 01/02/23 1316 97.9 F (36.6 C)  Temp src --      SpO2 01/02/23 1316 99 %     Weight --      Height --      Head Circumference --      Peak Flow --      Pain Score 01/02/23 1314 7     Pain Loc --      Pain Education --      Exclude from Growth Chart --    No data found.  Updated Vital Signs BP 112/67   Pulse 77   Temp 97.9 F (36.6 C)   Resp 20   LMP 12/27/2022   SpO2 99%   Visual Acuity Right Eye Distance:   Left Eye Distance:   Bilateral Distance:    Right Eye Near:   Left Eye Near:    Bilateral Near:     Physical Exam Vitals reviewed.  Constitutional:      General: She is not in acute distress.    Appearance: She is not ill-appearing, toxic-appearing or diaphoretic.  HENT:     Head:     Comments: There is swelling of the external right cheek.  There is no erythema.  It is tender.  I cannot discern any true fluctuance but the patient states she feels fluid in there.    Mouth/Throat:     Mouth: Mucous membranes are moist.     Comments: There is no mass noted on the internal buccal mucosa. Eyes:     Extraocular Movements: Extraocular movements intact.     Conjunctiva/sclera: Conjunctivae normal.      Pupils: Pupils are equal, round, and reactive to light.  Cardiovascular:     Rate and Rhythm: Normal rate and regular rhythm.     Heart sounds: No murmur heard. Pulmonary:     Effort: Pulmonary effort is normal.     Breath sounds: Normal breath sounds.  Musculoskeletal:     Cervical back: Neck supple.  Lymphadenopathy:     Cervical: No cervical adenopathy.  Skin:    Coloration: Skin is not jaundiced or pale.  Neurological:     General: No focal deficit present.     Mental Status: She is alert and oriented to person, place, and time.  Psychiatric:        Behavior: Behavior normal.      UC Treatments / Results  Labs (all labs ordered are listed, but only abnormal results are displayed) Labs Reviewed - No data to display  EKG   Radiology No results found.  Procedures Procedures (including critical care time)  Medications Ordered in UC Medications  ketorolac (TORADOL) 30 MG/ML injection 30 mg (has no administration in time range)    Initial Impression / Assessment and Plan / UC Course  I have reviewed the triage vital signs and the nursing notes.  Pertinent labs & imaging results that were available during my care of the patient were reviewed by me and considered in my medical decision making (see chart for details).      Augmentin is sent in for the cellulitis/dental infection.  Toradol injection is given here and Toradol tablets are sent to the pharmacy.  Last EGFR was greater than 60  We discussed seeking follow-up with a dentist. Final Clinical Impressions(s) / UC Diagnoses   Final diagnoses:  Dental infection     Discharge Instructions      You have been given a shot of Toradol 30 mg today.  Ketorolac 10 mg tablets--take 1 tablet every 6 hours as  needed for pain.  This is the same medicine that is in the shot we just gave you  --Take clindamycin 300 mg-- 1 capsule 3 times daily for 7 days     ED Prescriptions     Medication Sig Dispense  Auth. Provider   ketorolac (TORADOL) 10 MG tablet Take 1 tablet (10 mg total) by mouth every 6 (six) hours as needed (pain). 20 tablet Jaylei Fuerte, [name], MD   clindamycin (CLEOCIN) 300 MG capsule Take 1 capsule (300 mg total) by mouth 3 (three) times daily for 7 days. 21 capsule Zenia Resides, MD      PDMP not reviewed this encounter.   Zenia Resides, MD 01/02/23 [phone]

## 2023-01-02 NOTE — Discharge Instructions (Signed)
You have been given a shot of Toradol 30 mg today.  Ketorolac 10 mg tablets--take 1 tablet every 6 hours as needed for pain.  This is the same medicine that is in the shot we just gave you  --Take clindamycin 300 mg-- 1 capsule 3 times daily for 7 days

## 2023-04-20 NOTE — Progress Notes (Addendum)
[facility]   [phone] Fax:([phone]   Clinic New consult Note   Patient Care Team: [facility], [facility] as PCP - Foye Deer, DO as Consulting Physician (Neurology) 04/21/2023  CHIEF COMPLAINTS/PURPOSE OF CONSULTATION:  Iron deficiency anemia  HISTORY OF PRESENTING ILLNESS:  Taylor Duran 38 y.o. female is here because of iron deficiency anemia.  Referred from primary care after annual wellness visit on 04/14/2023.  She had complaints of fatigue.  CBC on 04/14/2023 noted hemoglobin of 5.5, hematocrit 20.4 MCV 69, platelets 294 iron 9, iron saturation 2, ferritin 7, vitamin B12 523, folic acid 4.7, and TIBC 403.  Her metabolic panel showed normal kidney functions and liver functions.  TSH also normal.  She has been treated for iron deficiency anemia at the [facility] cancer center in the past for iron deficiency anemia.  She has been treated with IV iron.  Her hemoglobin levels did improve slightly, however she never noticed any improvement in symptomology.  She has not been evaluated for anemia, other than primary care, since 2022. Today, she presents to the office alone. She denies chest pain, chest pressure, or shortness of breath. She denies headaches or visual disturbances. She denies abdominal pain, nausea, vomiting, or changes in bowel or bladder habits.  She continues to have monthly menstrual cycles. She states that as she gets a little older, they are becoming lighter. She has 4 healthy children between the ages of 40 and 14. She denies history of smoking. She denies use of alcohol or nonprescription drugs.   She was found to have abnormal CBC from 04/14/2023. Severe iron deficiency anemia. Has had problems with anemia since she was 38 years old.  She denies recent chest pain on exertion, shortness of breath on minimal exertion, pre-syncopal episodes, or palpitations. She had not noticed any recent bleeding such as epistaxis, hematuria or  hematochezia The patient denies over the counter NSAID ingestion. She is not  on antiplatelets agents. Her last colonoscopy was n/a due to age.  She had no prior history or diagnosis of cancer. Her age appropriate screening programs are up-to-date. She denies any pica and eats a variety of diet. She never donated blood or received blood transfusion. She has had treatment with IV iron in the past without robust response.  The patient was prescribed oral iron supplements and she was taking OTC oral iron with vitamin C. Has not been taking it for some time.    REVIEW OF SYSTEMS:   Constitutional: Denies fevers, chills or abnormal night sweats Eyes: Denies blurriness of vision, double vision or watery eyes Ears, nose, mouth, throat, and face: Denies mucositis or sore throat Respiratory: Denies cough, dyspnea or wheezes Cardiovascular: Denies palpitation, chest discomfort or lower extremity swelling Gastrointestinal:  Denies nausea, heartburn or change in bowel habits Skin: Denies abnormal skin rashes Lymphatics: Denies new lymphadenopathy or easy bruising Neurological:Denies numbness, tingling or new weaknesses Behavioral/Psych: Mood is stable, no new changes   All other systems were reviewed with the patient and are negative.   MEDICAL HISTORY:  Past Medical History:  Diagnosis Date   Anemia    Asthma    Heart murmur     SURGICAL HISTORY: Past Surgical History:  Procedure Laterality Date   DILATION AND CURETTAGE OF UTERUS     TUBAL LIGATION      SOCIAL HISTORY: Social History   Socioeconomic History   Marital status: Married    Spouse name: Not on file   Number  of children: 4   Years of education: college   Highest education level: Not on file  Occupational History   Occupation: not working  Tobacco Use   Smoking status: Never   Smokeless tobacco: Never  Vaping Use   Vaping status: Never Used  Substance and Sexual Activity   Alcohol use: No   Drug use: No    Sexual activity: Yes    Birth control/protection: Surgical  Other Topics Concern   Not on file  Social History Narrative   Lives at home with husband and children   Right handed   Drinks about 6 cups of caffeine daily   Two story home   Social Drivers of Health   Financial Resource Strain: Patient Declined (04/21/2023)   Overall Financial Resource Strain (CARDIA)    Difficulty of Paying Living Expenses: Patient declined  Food Insecurity: Unknown (04/21/2023)   Hunger Vital Sign    Worried About Running Out of Food in the Last Year: Not on file    Ran Out of Food in the Last Year: Never true  Transportation Needs: No Transportation Needs (04/21/2023)   PRAPARE - Administrator, Civil Service (Medical): No    Lack of Transportation (Non-Medical): No  Physical Activity: Unknown (04/21/2023)   Exercise Vital Sign    Days of Exercise per Week: Patient declined    Minutes of Exercise per Session: Not on file  Stress: Patient Declined (04/21/2023)   [employer] of Occupational Health - Occupational Stress Questionnaire    Feeling of Stress : Patient declined  Social Connections: Moderately Integrated (07/27/2020)   Received from [facility]   Social Connection and Isolation Panel [NHANES]    Frequency of Communication with Friends and Family: More than three times a week    Frequency of Social Gatherings with Friends and Family: Twice a week    Attends Religious Services: More than 4 times per year    Active Member of [employer] or Organizations: No    Attends Banker Meetings: Never    Marital Status: Married  Catering manager Violence: Not At Risk (04/21/2023)   Humiliation, Afraid, Rape, and Kick questionnaire    Fear of Current or Ex-Partner: No    Emotionally Abused: No    Physically Abused: No    Sexually Abused: No    FAMILY HISTORY: Family History  Problem Relation Age of Onset   Migraines Mother    Stomach cancer Maternal  Grandmother    Colon cancer Maternal Grandfather    Alzheimer's disease Paternal Grandmother    Parkinson's disease Paternal Grandmother    Breast cancer Paternal Aunt    Breast cancer Paternal Aunt    Lung cancer Paternal Aunt    Breast cancer Paternal Aunt     ALLERGIES:  is allergic to shellfish allergy and penicillins.  MEDICATIONS:  No current outpatient medications on file.   No current facility-administered medications for this visit.    PHYSICAL EXAMINATION: ECOG PERFORMANCE STATUS: 1 - Symptomatic but completely ambulatory  Vitals:   04/21/23 1119  BP: (!) 118/54  Pulse: 71  Resp: 18  Temp: 97.9 F (36.6 C)  SpO2: 100%   Filed Weights   04/21/23 1119  Weight: 190 lb 4.8 oz (86.3 kg)    GENERAL:alert, no distress and comfortable SKIN: skin color, texture, turgor are normal, no rashes or significant lesions EYES: normal, conjunctiva are pink and non-injected, sclera clear OROPHARYNX:no exudate, no erythema and lips, buccal mucosa, and  tongue normal  NECK: supple, thyroid normal size, non-tender, without nodularity LYMPH:  no palpable lymphadenopathy in the cervical, axillary or inguinal LUNGS: clear to auscultation and percussion with normal breathing effort HEART: regular rate & rhythm and no murmurs and no lower extremity edema ABDOMEN:abdomen soft, non-tender and normal bowel sounds Musculoskeletal:no cyanosis of digits and no clubbing  PSYCH: alert & oriented x 3 with fluent speech NEURO: no focal motor/sensory deficits  LABORATORY DATA:  I have reviewed the data as listed    Latest Ref Rng & Units 01/24/2022    1:00 PM 01/21/2017    8:39 AM 03/05/2016    6:46 PM  CBC  WBC 4.0 - 10.5 K/uL 5.6  5.6  7.3   Hemoglobin 12.0 - 15.0 g/dL 7.9  7.6  8.9   Hematocrit 36.0 - 46.0 % 27.9  26.6  29.4   Platelets 150 - 400 K/uL 265  418  399        Latest Ref Rng & Units 01/24/2022    1:00 PM 01/21/2017    8:39 AM 03/05/2016    6:46 PM  CMP  Glucose  70 - 99 mg/dL 97  84  98   BUN 6 - 20 mg/dL 5  6  6    Creatinine 0.44 - 1.00 mg/dL 1.61  0.96  0.45   Sodium 135 - 145 mmol/L 136  140  140   Potassium 3.5 - 5.1 mmol/L 3.1  3.8  3.3   Chloride 98 - 111 mmol/L 104  102  104   CO2 22 - 32 mmol/L 25  24  27    Calcium 8.9 - 10.3 mg/dL 8.4  9.2  9.0   Total Protein 6.0 - 8.5 g/dL  7.0    Total Bilirubin 0.0 - 1.2 mg/dL  0.3    Alkaline Phos 39 - 117 IU/L  81    AST 0 - 40 IU/L  13    ALT 0 - 32 IU/L  8        Orders Placed This Encounter  Procedures   CBC with Differential (Cancer Center Only)    Standing Status:   Future    Number of Occurrences:   1    Expected Date:   04/21/2023    Expiration Date:   04/20/2024   Vitamin B12    Standing Status:   Future    Number of Occurrences:   1    Expected Date:   04/21/2023    Expiration Date:   04/20/2024   Folate, Serum    Standing Status:   Future    Number of Occurrences:   1    Expected Date:   04/21/2023    Expiration Date:   04/20/2024   Ferritin    Standing Status:   Future    Number of Occurrences:   1    Expected Date:   04/21/2023    Expiration Date:   04/20/2024   Retic Panel    Standing Status:   Future    Number of Occurrences:   1    Expected Date:   04/21/2023    Expiration Date:   04/20/2024   Erythropoietin    Standing Status:   Future    Number of Occurrences:   1    Expected Date:   04/21/2023    Expiration Date:   04/20/2024    Assessment and Plan Iron deficiency anemia due to chronic blood loss Assessment & Plan: Referred from primary care after annual  wellness visit on 04/14/2023.  She had complaints of fatigue.  CBC on 04/14/2023 noted hemoglobin of 5.5, hematocrit 20.4 MCV 69, platelets 294 iron 9, iron saturation 2, ferritin 7, vitamin B12 523, folic acid 4.7, and TIBC 403.  Her metabolic panel showed normal kidney functions and liver functions.  TSH also normal.  She has been treated for iron deficiency anemia at the [facility] cancer center in the past for iron  deficiency anemia.  She has been treated with IV iron.  Her hemoglobin levels did improve slightly, however she never noticed any improvement in symptomology.  She has not been evaluated for anemia, other than primary care, since 2022. She continues to have monthly menstrual cycles. She states that as she gets a little older, they are becoming lighter.  She denies blood in her stool, hematuria, or hematemesis.  She has not had GI workup, but is willing to have referral to GI if IV iron not effective. Check blood count with full iron studies, B12 level, folic acid, erythropoiesis, and reticulocytes.  -Plan to start IV Ferrlecit 250 mg IV, getting 5 weekly treatments for total of 1250 mg.  Plan to see her back in approximately 8 weeks with recheck of labs. -Refer to GI for workup if iron studies do not improve after first full cycle of IV iron.  She is agreeable to this plan.  Orders: -     CBC with Differential (Cancer Center Only); Future -     Vitamin B12; Future -     Folate; Future -     Ferritin; Future -     Retic Panel; Future -     Erythropoietin; Future  Other orders -     methylPREDNISolone Sodium Succ -     Acetaminophen -     diphenhydrAMINE HCl -     ferric gluconate (FERRLECIT) 250 mg in sodium chloride 0.9 % 250 mL IVPB -     Famotidine in NaCl -     Sodium Chloride -     methylPREDNISolone Sodium Succ -     diphenhydrAMINE HCl -     Albuterol Sulfate HFA -     EPINEPHrine     The patient was seen along with Dr. Mosetta Putt today. All questions were answered. The patient knows to call the clinic with any problems, questions or concerns. The total time spent in the appointment was 30 minutes.     Taylor Jews, NP 04/21/2023 1:42 PM  ADDENDUM I have seen the patient, examined her. I agree with the assessment and and plan and have edited the notes.   Patient is a 38 year old female with known history of iron deficient anemia since teenager, probably related to her  menorrhagia.  No other overt GI bleeding, she declined stool OB test. She previously received IV iron.  She does not tolerate oral iron well.  Her recent outside lab showed severe anemia with iron deficiency.  She denies blood transfusion, will repeat her CBC and iron study today, also check B12, folate, and reticular panel.  Will set up IV iron 1g for her with her insurance approval.  Potential allergy reaction discussed with her, she agrees to proceed.  I recommend close follow-up, but she is not interested in close lab monitoring. Will see her back in 2 months with lab to ensure her anemia improves and hopefully resolves  I spent a total of 30 minutes for her visit today, more than 50% time on face-to-face counseling.  Malachy Mood MD  04/21/2023    

## 2023-04-21 ENCOUNTER — Other Ambulatory Visit: Payer: Medicaid Other

## 2023-04-21 ENCOUNTER — Encounter: Payer: Self-pay | Admitting: Nurse Practitioner

## 2023-04-21 ENCOUNTER — Inpatient Hospital Stay (HOSPITAL_BASED_OUTPATIENT_CLINIC_OR_DEPARTMENT_OTHER): Payer: Medicaid Other | Admitting: Nurse Practitioner

## 2023-04-21 ENCOUNTER — Inpatient Hospital Stay: Payer: Medicaid Other | Attending: Nurse Practitioner

## 2023-04-21 VITALS — BP 118/54 | HR 71 | Temp 97.9°F | Resp 18 | Ht 65.0 in | Wt 190.3 lb

## 2023-04-21 DIAGNOSIS — Z803 Family history of malignant neoplasm of breast: Secondary | ICD-10-CM | POA: Diagnosis not present

## 2023-04-21 DIAGNOSIS — N920 Excessive and frequent menstruation with regular cycle: Secondary | ICD-10-CM | POA: Diagnosis not present

## 2023-04-21 DIAGNOSIS — Z801 Family history of malignant neoplasm of trachea, bronchus and lung: Secondary | ICD-10-CM | POA: Insufficient documentation

## 2023-04-21 DIAGNOSIS — Z800 Family history of malignant neoplasm of digestive organs: Secondary | ICD-10-CM | POA: Diagnosis not present

## 2023-04-21 DIAGNOSIS — D500 Iron deficiency anemia secondary to blood loss (chronic): Secondary | ICD-10-CM | POA: Insufficient documentation

## 2023-04-21 DIAGNOSIS — D509 Iron deficiency anemia, unspecified: Secondary | ICD-10-CM

## 2023-04-21 LAB — CBC WITH DIFFERENTIAL (CANCER CENTER ONLY)
Abs Immature Granulocytes: 0.01 10*3/uL (ref 0.00–0.07)
Basophils Absolute: 0.1 10*3/uL (ref 0.0–0.1)
Basophils Relative: 2 %
Eosinophils Absolute: 0.2 10*3/uL (ref 0.0–0.5)
Eosinophils Relative: 3 %
HCT: 20.7 % — ABNORMAL LOW (ref 36.0–46.0)
Hemoglobin: 5.6 g/dL — CL (ref 12.0–15.0)
Immature Granulocytes: 0 %
Lymphocytes Relative: 34 %
Lymphs Abs: 1.8 10*3/uL (ref 0.7–4.0)
MCH: 17.9 pg — ABNORMAL LOW (ref 26.0–34.0)
MCHC: 27.1 g/dL — ABNORMAL LOW (ref 30.0–36.0)
MCV: 66.1 fL — ABNORMAL LOW (ref 80.0–100.0)
Monocytes Absolute: 0.5 10*3/uL (ref 0.1–1.0)
Monocytes Relative: 10 %
Neutro Abs: 2.7 10*3/uL (ref 1.7–7.7)
Neutrophils Relative %: 51 %
Platelet Count: 1312 10*3/uL (ref 150–400)
RBC: 3.13 MIL/uL — ABNORMAL LOW (ref 3.87–5.11)
RDW: 30.8 % — ABNORMAL HIGH (ref 11.5–15.5)
WBC Count: 5.2 10*3/uL (ref 4.0–10.5)
nRBC: 0.4 % — ABNORMAL HIGH (ref 0.0–0.2)

## 2023-04-21 LAB — RETIC PANEL
Immature Retic Fract: 15.9 % (ref 2.3–15.9)
RBC.: 3.25 MIL/uL — ABNORMAL LOW (ref 3.87–5.11)
Retic Count, Absolute: 28 10*3/uL (ref 19.0–186.0)
Retic Ct Pct: 0.9 % (ref 0.4–3.1)
Reticulocyte Hemoglobin: 15.1 pg — ABNORMAL LOW (ref >27.9)

## 2023-04-21 LAB — IRON AND IRON BINDING CAPACITY (CC-WL,HP ONLY)
Iron: 6 ug/dL — ABNORMAL LOW (ref 28–170)
Saturation Ratios: 1 % — ABNORMAL LOW (ref 10.4–31.8)
TIBC: 525 ug/dL — ABNORMAL HIGH (ref 250–450)
UIBC: 519 ug/dL — ABNORMAL HIGH (ref 148–442)

## 2023-04-21 LAB — FERRITIN: Ferritin: 1 ng/mL — ABNORMAL LOW (ref 11–307)

## 2023-04-21 LAB — FOLATE: Folate: 7 ng/mL (ref >5.9)

## 2023-04-21 LAB — VITAMIN B12: Vitamin B-12: 369 pg/mL (ref 180–914)

## 2023-04-21 NOTE — Assessment & Plan Note (Signed)
Referred from primary care after annual wellness visit on 04/14/2023.  She had complaints of fatigue.  CBC on 04/14/2023 noted hemoglobin of 5.5, hematocrit 20.4 MCV 69, platelets 294 iron 9, iron saturation 2, ferritin 7, vitamin B12 523, folic acid 4.7, and TIBC 403.  Her metabolic panel showed normal kidney functions and liver functions.  TSH also normal.  She has been treated for iron deficiency anemia at the [facility] cancer center in the past for iron deficiency anemia.  She has been treated with IV iron.  Her hemoglobin levels did improve slightly, however she never noticed any improvement in symptomology.  She has not been evaluated for anemia, other than primary care, since 2022. She continues to have monthly menstrual cycles. She states that as she gets a little older, they are becoming lighter.  She denies blood in her stool, hematuria, or hematemesis.  She has not had GI workup, but is willing to have referral to GI if IV iron not effective. Check blood count with full iron studies, B12 level, folic acid, erythropoiesis, and reticulocytes.  -Plan to start IV Ferrlecit 250 mg IV, getting 5 weekly treatments for total of 1250 mg.  Plan to see her back in approximately 8 weeks with recheck of labs. -Refer to GI for workup if iron studies do not improve after first full cycle of IV iron.  She is agreeable to this plan.

## 2023-04-22 ENCOUNTER — Telehealth: Payer: Self-pay | Admitting: Pharmacy Technician

## 2023-04-22 LAB — ERYTHROPOIETIN: Erythropoietin: 792 m[IU]/mL — ABNORMAL HIGH (ref 2.6–18.5)

## 2023-04-22 NOTE — Telephone Encounter (Signed)
Ok, will do. Thanks Selena Batten

## 2023-04-22 NOTE — Telephone Encounter (Signed)
Heather, For clarification:  Typically the Ferelicet is x4 doses (1gm) You would like x5 doses???  Auth Submission: NO AUTH NEEDED Site of care: Site of care: [facility] Payer: Medicaid [employer] Medication & CPT/J Code(s) submitted: Ferrlecit (Ferric Gluconate) [id number] Route of submission (phone, fax, portal):  Phone # Fax # Auth type: Buy/Bill PB Units/visits requested: x5 Reference number:  Approval from: 04/22/23 to 09/19/23

## 2023-04-29 ENCOUNTER — Ambulatory Visit: Payer: Medicaid Other

## 2023-05-06 ENCOUNTER — Ambulatory Visit (INDEPENDENT_AMBULATORY_CARE_PROVIDER_SITE_OTHER): Payer: Medicaid Other

## 2023-05-06 VITALS — BP 100/67 | HR 56 | Temp 98.1°F | Resp 18 | Ht 66.0 in | Wt 187.6 lb

## 2023-05-06 DIAGNOSIS — N920 Excessive and frequent menstruation with regular cycle: Secondary | ICD-10-CM | POA: Diagnosis not present

## 2023-05-06 DIAGNOSIS — D509 Iron deficiency anemia, unspecified: Secondary | ICD-10-CM | POA: Diagnosis not present

## 2023-05-06 DIAGNOSIS — D500 Iron deficiency anemia secondary to blood loss (chronic): Secondary | ICD-10-CM

## 2023-05-06 MED ORDER — DIPHENHYDRAMINE HCL 25 MG PO CAPS
25.0000 mg | ORAL_CAPSULE | Freq: Once | ORAL | Status: AC
  Administered 2023-05-06: 25 mg via ORAL
  Filled 2023-05-06: qty 1

## 2023-05-06 MED ORDER — ACETAMINOPHEN 325 MG PO TABS
650.0000 mg | ORAL_TABLET | Freq: Once | ORAL | Status: AC
  Administered 2023-05-06: 650 mg via ORAL
  Filled 2023-05-06: qty 2

## 2023-05-06 MED ORDER — METHYLPREDNISOLONE SODIUM SUCC 40 MG IJ SOLR
40.0000 mg | Freq: Once | INTRAMUSCULAR | Status: AC
  Administered 2023-05-06: 40 mg via INTRAVENOUS
  Filled 2023-05-06: qty 1

## 2023-05-06 MED ORDER — NA FERRIC GLUC CPLX IN SUCROSE 12.5 MG/ML IV SOLN
250.0000 mg | Freq: Once | INTRAVENOUS | Status: AC
  Administered 2023-05-06: 250 mg via INTRAVENOUS
  Filled 2023-05-06: qty 20

## 2023-05-06 NOTE — Progress Notes (Signed)
 Diagnosis: Iron Deficiency Anemia  Provider:  Chilton Greathouse MD  Procedure: IV Infusion  IV Type: Peripheral, IV Location: R Hand  Ferrlecit (ferric gluconate), Dose: 250 mg  Infusion Start Time: 1012  Infusion Stop Time: 1229  Post Infusion IV Care: Observation period completed and Peripheral IV Discontinued  Discharge: Condition: Good, Destination: Home . AVS Provided  Performed by:  Wyvonne Lenz, RN    Work excuse updated with appointment date, time, approximate length and detail of the infusion timing as ordered by the provider, per patient's request.

## 2023-05-13 ENCOUNTER — Ambulatory Visit: Payer: Medicaid Other

## 2023-05-13 VITALS — BP 117/74 | HR 73 | Temp 97.5°F | Resp 18 | Ht 66.0 in | Wt 192.0 lb

## 2023-05-13 DIAGNOSIS — D500 Iron deficiency anemia secondary to blood loss (chronic): Secondary | ICD-10-CM | POA: Diagnosis not present

## 2023-05-13 DIAGNOSIS — N920 Excessive and frequent menstruation with regular cycle: Secondary | ICD-10-CM | POA: Diagnosis not present

## 2023-05-13 MED ORDER — DIPHENHYDRAMINE HCL 25 MG PO CAPS
25.0000 mg | ORAL_CAPSULE | Freq: Once | ORAL | Status: AC
  Administered 2023-05-13: 25 mg via ORAL
  Filled 2023-05-13: qty 1

## 2023-05-13 MED ORDER — SODIUM CHLORIDE 0.9 % IV SOLN
250.0000 mg | Freq: Once | INTRAVENOUS | Status: AC
  Administered 2023-05-13: 250 mg via INTRAVENOUS
  Filled 2023-05-13: qty 20

## 2023-05-13 MED ORDER — ACETAMINOPHEN 325 MG PO TABS
650.0000 mg | ORAL_TABLET | Freq: Once | ORAL | Status: AC
  Administered 2023-05-13: 650 mg via ORAL
  Filled 2023-05-13: qty 2

## 2023-05-13 MED ORDER — METHYLPREDNISOLONE SODIUM SUCC 40 MG IJ SOLR
40.0000 mg | Freq: Once | INTRAMUSCULAR | Status: AC
  Administered 2023-05-13: 40 mg via INTRAVENOUS
  Filled 2023-05-13: qty 1

## 2023-05-13 NOTE — Progress Notes (Signed)
 Diagnosis: Iron Deficiency Anemia  Provider:  Chilton Greathouse MD  Procedure: IV Infusion  IV Type: Peripheral, IV Location: R Hand  Ferrlecit (ferric gluconate), Dose: 250 mg  Infusion Start Time: 1016  Infusion Stop Time: 1228  Post Infusion IV Care: Observation period completed and Peripheral IV Discontinued  Discharge: Condition: Good, Destination: Home . AVS Declined  Performed by:  Garnette Czech, RN

## 2023-05-20 ENCOUNTER — Ambulatory Visit: Payer: Medicaid Other

## 2023-05-20 VITALS — BP 117/71 | HR 63 | Temp 98.0°F | Resp 16 | Ht 66.0 in | Wt 187.6 lb

## 2023-05-20 DIAGNOSIS — N920 Excessive and frequent menstruation with regular cycle: Secondary | ICD-10-CM | POA: Diagnosis not present

## 2023-05-20 DIAGNOSIS — D500 Iron deficiency anemia secondary to blood loss (chronic): Secondary | ICD-10-CM | POA: Diagnosis not present

## 2023-05-20 MED ORDER — DIPHENHYDRAMINE HCL 25 MG PO CAPS
25.0000 mg | ORAL_CAPSULE | Freq: Once | ORAL | Status: AC
  Administered 2023-05-20: 25 mg via ORAL
  Filled 2023-05-20: qty 1

## 2023-05-20 MED ORDER — METHYLPREDNISOLONE SODIUM SUCC 40 MG IJ SOLR
40.0000 mg | Freq: Once | INTRAMUSCULAR | Status: AC
  Administered 2023-05-20: 40 mg via INTRAVENOUS
  Filled 2023-05-20: qty 1

## 2023-05-20 MED ORDER — SODIUM CHLORIDE 0.9 % IV SOLN
250.0000 mg | Freq: Once | INTRAVENOUS | Status: AC
  Administered 2023-05-20: 250 mg via INTRAVENOUS
  Filled 2023-05-20: qty 20

## 2023-05-20 MED ORDER — ACETAMINOPHEN 325 MG PO TABS
650.0000 mg | ORAL_TABLET | Freq: Once | ORAL | Status: AC
Start: 2023-05-20 — End: 2023-05-20
  Administered 2023-05-20: 650 mg via ORAL
  Filled 2023-05-20: qty 2

## 2023-05-20 NOTE — Progress Notes (Signed)
 Diagnosis: Iron Deficiency Anemia  Provider:  Chilton Greathouse MD  Procedure: IV Infusion  IV Type: Peripheral, IV Location: R Hand  Ferrlecit (ferric gluconate), Dose: 250 mg  Infusion Start Time: 0958  Infusion Stop Time: 1158  Post Infusion IV Care: Patient declined observation and Peripheral IV Discontinued  Discharge: Condition: Good, Destination: Home . AVS Declined  Performed by:  Garnette Czech, RN

## 2023-05-27 ENCOUNTER — Ambulatory Visit: Payer: Medicaid Other

## 2023-05-27 VITALS — BP 111/68 | HR 70 | Temp 98.0°F | Resp 16 | Ht 66.0 in | Wt 188.0 lb

## 2023-05-27 DIAGNOSIS — D500 Iron deficiency anemia secondary to blood loss (chronic): Secondary | ICD-10-CM | POA: Diagnosis not present

## 2023-05-27 DIAGNOSIS — N920 Excessive and frequent menstruation with regular cycle: Secondary | ICD-10-CM | POA: Diagnosis not present

## 2023-05-27 MED ORDER — DIPHENHYDRAMINE HCL 25 MG PO CAPS
25.0000 mg | ORAL_CAPSULE | Freq: Once | ORAL | Status: AC
Start: 2023-05-27 — End: 2023-05-27
  Administered 2023-05-27: 25 mg via ORAL
  Filled 2023-05-27: qty 1

## 2023-05-27 MED ORDER — ACETAMINOPHEN 325 MG PO TABS
650.0000 mg | ORAL_TABLET | Freq: Once | ORAL | Status: AC
  Administered 2023-05-27: 650 mg via ORAL
  Filled 2023-05-27: qty 2

## 2023-05-27 MED ORDER — METHYLPREDNISOLONE SODIUM SUCC 40 MG IJ SOLR
40.0000 mg | Freq: Once | INTRAMUSCULAR | Status: AC
  Administered 2023-05-27: 40 mg via INTRAVENOUS
  Filled 2023-05-27: qty 1

## 2023-05-27 MED ORDER — SODIUM CHLORIDE 0.9 % IV SOLN
250.0000 mg | Freq: Once | INTRAVENOUS | Status: AC
  Administered 2023-05-27: 250 mg via INTRAVENOUS
  Filled 2023-05-27: qty 20

## 2023-05-27 NOTE — Progress Notes (Signed)
 Diagnosis: Acute Anemia  Provider:  Chilton Greathouse MD  Procedure: IV Infusion  IV Type: Peripheral, IV Location: R Forearm  Ferrlecit (ferric gluconate), Dose: 250 mg  Infusion Start Time: 0947  Infusion Stop Time: 1200  Post Infusion IV Care: Peripheral IV Discontinued and pt stayed 16 minutes of observation time, then requested to leave   Discharge: Condition: Good, Destination: Home . AVS Declined  Performed by:  Nat Math, RN

## 2023-06-03 ENCOUNTER — Ambulatory Visit

## 2023-06-03 VITALS — BP 112/60 | HR 64 | Temp 97.3°F | Resp 20 | Ht 66.0 in | Wt 188.8 lb

## 2023-06-03 DIAGNOSIS — N920 Excessive and frequent menstruation with regular cycle: Secondary | ICD-10-CM

## 2023-06-03 DIAGNOSIS — D500 Iron deficiency anemia secondary to blood loss (chronic): Secondary | ICD-10-CM

## 2023-06-03 MED ORDER — METHYLPREDNISOLONE SODIUM SUCC 40 MG IJ SOLR
40.0000 mg | Freq: Once | INTRAMUSCULAR | Status: AC
  Administered 2023-06-03: 40 mg via INTRAVENOUS
  Filled 2023-06-03: qty 1

## 2023-06-03 MED ORDER — DIPHENHYDRAMINE HCL 25 MG PO CAPS
25.0000 mg | ORAL_CAPSULE | Freq: Once | ORAL | Status: AC
  Administered 2023-06-03: 25 mg via ORAL
  Filled 2023-06-03: qty 1

## 2023-06-03 MED ORDER — SODIUM CHLORIDE 0.9 % IV SOLN
250.0000 mg | Freq: Once | INTRAVENOUS | Status: AC
  Administered 2023-06-03: 250 mg via INTRAVENOUS
  Filled 2023-06-03: qty 20

## 2023-06-03 MED ORDER — ACETAMINOPHEN 325 MG PO TABS
650.0000 mg | ORAL_TABLET | Freq: Once | ORAL | Status: AC
  Administered 2023-06-03: 650 mg via ORAL
  Filled 2023-06-03: qty 2

## 2023-06-03 NOTE — Progress Notes (Signed)
 Diagnosis: Iron Deficiency Anemia  Provider:  Chilton Greathouse MD  Procedure: IV Infusion  IV Type: Peripheral, IV Location: R Forearm  Ferrlecit (ferric gluconate), Dose: 250 mg  Infusion Start Time: 1000  Infusion Stop Time: 1223  Post Infusion IV Care: Observation period completed and Peripheral IV Discontinued  Discharge: Condition: Good, Destination: Home . AVS Declined  Performed by:  Garnette Czech, RN

## 2023-06-10 ENCOUNTER — Ambulatory Visit: Payer: Medicaid Other

## 2023-06-18 ENCOUNTER — Other Ambulatory Visit: Payer: Self-pay | Admitting: Nurse Practitioner

## 2023-06-18 DIAGNOSIS — D500 Iron deficiency anemia secondary to blood loss (chronic): Secondary | ICD-10-CM

## 2023-06-18 NOTE — Progress Notes (Signed)
 Patient Care Team: [name], [name], MD as PCP - General (Pulmonary Disease) Merriam Abbey, DO as Consulting Physician (Neurology)  Clinic Day:  06/26/2023  Referring physician: [facility]  ASSESSMENT & PLAN:   Assessment & Plan: Iron deficiency anemia due to chronic blood loss Referred from primary care after annual wellness visit on 04/14/2023.  She had complaints of fatigue.  CBC on 04/14/2023 noted hemoglobin of 5.5, hematocrit 20.4 MCV 69, platelets 294 iron 9, iron saturation 2, ferritin 7, vitamin B12 523, folic acid  4.7, and TIBC 403.  Her metabolic panel showed normal kidney functions and liver functions.  TSH also normal.  She has been treated for iron deficiency anemia at the [facility] center in the past for iron deficiency anemia.  She has been treated with IV iron.  Her hemoglobin levels did improve slightly, however she never noticed any improvement in symptomology.  She has not been evaluated for anemia, other than primary care, since 2022. She continues to have monthly menstrual cycles. She states that as she gets a little older, they are becoming lighter.  She denies blood in her stool, hematuria, or hematemesis.  She has not had GI workup, but is willing to have referral to GI if IV iron not effective. -06/18/2023 - blood counts improved after initial 5 treatments with IV Venofer, though still anemic with Hgb 9.6, HCT 31.8, iron 37, saturation at 10%.  Ferritin levels are pending at time of this visit. --Will likely treat with additional 5 treatments of IV Venofer.  Plan to treat if ferritin <50.  Will see her back in 2 months with additional lab check at that time. -Refer to GI for workup if iron studies begin to fall again.  She is agreeable to this plan.   Plan: Reviewed labs. - Improved but persistent iron deficiency anemia.  Awaiting ferritin results. - Will plan for additional 5 treatments of IV Venofer if ferritin <50. -Consider referral to GI if iron levels  and blood count remains low after second cycle of IV Venofer.  Patient is agreeable with this plan. -Recheck labs and follow-up in 2 months.  The patient understands the plans discussed today and is in agreement with them.  She knows to contact our office if she develops concerns prior to her next appointment.  I provided 20 minutes of face-to-face time during this encounter and > 50% was spent counseling as documented under my assessment and plan.    Sharyon Deis, NP  [facility] - A DEPT OF [facility]. [facility] [address] [place] [address] Dept: [phone] Dept Fax: [phone]   No orders of the defined types were placed in this encounter.     CHIEF COMPLAINT:  CC: Iron deficiency anemia due to chronic blood loss  Current Treatment: IV Venofer  INTERVAL HISTORY:  Taylor Duran is here today for repeat clinical assessment.  Her initial visit at the cancer center was 04/21/2023.  At the time, her Hgb was 7.9, HCT 27.9, iron 6, TIBC 525, sat ratio 1%, UIBC 519.  Ferritin was 1.  She has had 5 doses of IV iron.  Her blood count has improved with Hgb 9.6, HCT 31.8, iron 37, TIBC 360, and saturation ratio of 10%.  Awaiting ferritin results.  She reports tolerating IV Venofer very well.  Reports improved energy levels.  Denies negative side effects from IV iron.  She denies chest pain, chest pressure, or shortness  of breath. She denies headaches or visual disturbances. She denies abdominal pain, nausea, vomiting, or changes in bowel or bladder habits.  She denies fevers or chills. She denies pain. Her appetite is good. Her weight has been stable.  I have reviewed the past medical history, past surgical history, social history and family history with the patient and they are unchanged from previous note.  ALLERGIES:  is allergic to shellfish allergy and penicillins.  MEDICATIONS:  No current outpatient medications on file.    No current facility-administered medications for this visit.    REVIEW OF SYSTEMS:   Constitutional: Denies fevers, chills or abnormal weight loss. Improved fatigue  Eyes: Denies blurriness of vision Ears, nose, mouth, throat, and face: Denies mucositis or sore throat Respiratory: Denies cough, dyspnea or wheezes Cardiovascular: Denies palpitation, chest discomfort or lower extremity swelling Gastrointestinal:  Denies nausea, heartburn or change in bowel habits Skin: Denies abnormal skin rashes Lymphatics: Denies new lymphadenopathy or easy bruising Neurological:Denies numbness, tingling or new weaknesses Behavioral/Psych: Mood is stable, no new changes  All other systems were reviewed with the patient and are negative.   VITALS:   Today's Vitals   06/19/23 1303 06/19/23 1305  BP: 110/60   Pulse: 75   Resp: (!) 21   Temp: 97.8 F (36.6 C)   TempSrc: Temporal   SpO2: 97%   Weight: 191 lb (86.6 kg)   Height: 5\' 6"  (1.676 m)   PainSc:  0-No pain   Body mass index is 30.83 kg/m.   Wt Readings from Last 3 Encounters:  06/19/23 191 lb (86.6 kg)  06/03/23 188 lb 12.8 oz (85.6 kg)  05/27/23 188 lb (85.3 kg)    Body mass index is 30.83 kg/m.  Performance status (ECOG): 1 - Symptomatic but completely ambulatory  PHYSICAL EXAM:   GENERAL:alert, no distress and comfortable SKIN: skin color, texture, turgor are normal, no rashes or significant lesions EYES: normal, Conjunctiva are pink and non-injected, sclera clear OROPHARYNX:no exudate, no erythema and lips, buccal mucosa, and tongue normal  NECK: supple, thyroid normal size, non-tender, without nodularity LYMPH:  no palpable lymphadenopathy in the cervical, axillary or inguinal LUNGS: clear to auscultation and percussion with normal breathing effort HEART: regular rate & rhythm and no murmurs and no lower extremity edema ABDOMEN:abdomen soft, non-tender and normal bowel sounds Musculoskeletal:no cyanosis of digits  and no clubbing  NEURO: alert & oriented x 3 with fluent speech, no focal motor/sensory deficits  LABORATORY DATA:  I have reviewed the data as listed    Component Value Date/Time   NA 136 01/24/2022 1300   NA 140 01/21/2017 0839   NA 139 12/30/2011 1400   K 3.1 (L) 01/24/2022 1300   K 3.5 12/30/2011 1400   CL 104 01/24/2022 1300   CL 107 12/30/2011 1400   CO2 25 01/24/2022 1300   CO2 22 12/30/2011 1400   GLUCOSE 97 01/24/2022 1300   GLUCOSE 76 12/30/2011 1400   BUN <5 (L) 01/24/2022 1300   BUN 6 01/21/2017 0839   BUN 4 (L) 12/30/2011 1400   CREATININE 0.52 01/24/2022 1300   CREATININE 0.48 (L) 12/30/2011 1400   CALCIUM 8.4 (L) 01/24/2022 1300   CALCIUM 8.2 (L) 12/30/2011 1400   PROT 7.0 01/21/2017 0839   ALBUMIN 4.1 01/21/2017 0839   ALBUMIN 4.6 01/15/2017 1330   AST 13 01/21/2017 0839   ALT 8 01/21/2017 0839   ALKPHOS 81 01/21/2017 0839   BILITOT 0.3 01/21/2017 0839   GFRNONAA >60 01/24/2022 1300  GFRNONAA >60 12/30/2011 1400   GFRAA 136 01/21/2017 0839   GFRAA >60 12/30/2011 1400    Lab Results  Component Value Date   WBC 3.9 (L) 06/19/2023   NEUTROABS 2.2 06/19/2023   HGB 9.6 (L) 06/19/2023   HCT 31.8 (L) 06/19/2023   MCV 81.1 06/19/2023   PLT 283 06/19/2023

## 2023-06-19 ENCOUNTER — Inpatient Hospital Stay: Payer: Medicaid Other | Attending: Nurse Practitioner

## 2023-06-19 ENCOUNTER — Inpatient Hospital Stay (HOSPITAL_BASED_OUTPATIENT_CLINIC_OR_DEPARTMENT_OTHER): Payer: Medicaid Other | Admitting: Nurse Practitioner

## 2023-06-19 VITALS — BP 110/60 | HR 75 | Temp 97.8°F | Resp 21 | Ht 66.0 in | Wt 191.0 lb

## 2023-06-19 DIAGNOSIS — D500 Iron deficiency anemia secondary to blood loss (chronic): Secondary | ICD-10-CM | POA: Diagnosis not present

## 2023-06-19 LAB — CBC WITH DIFFERENTIAL (CANCER CENTER ONLY)
Abs Immature Granulocytes: 0.01 10*3/uL (ref 0.00–0.07)
Basophils Absolute: 0 10*3/uL (ref 0.0–0.1)
Basophils Relative: 1 %
Eosinophils Absolute: 0.2 10*3/uL (ref 0.0–0.5)
Eosinophils Relative: 5 %
HCT: 31.8 % — ABNORMAL LOW (ref 36.0–46.0)
Hemoglobin: 9.6 g/dL — ABNORMAL LOW (ref 12.0–15.0)
Immature Granulocytes: 0 %
Lymphocytes Relative: 30 %
Lymphs Abs: 1.2 10*3/uL (ref 0.7–4.0)
MCH: 24.5 pg — ABNORMAL LOW (ref 26.0–34.0)
MCHC: 30.2 g/dL (ref 30.0–36.0)
MCV: 81.1 fL (ref 80.0–100.0)
Monocytes Absolute: 0.2 10*3/uL (ref 0.1–1.0)
Monocytes Relative: 6 %
Neutro Abs: 2.2 10*3/uL (ref 1.7–7.7)
Neutrophils Relative %: 58 %
Platelet Count: 283 10*3/uL (ref 150–400)
RBC: 3.92 MIL/uL (ref 3.87–5.11)
WBC Count: 3.9 10*3/uL — ABNORMAL LOW (ref 4.0–10.5)
nRBC: 0 % (ref 0.0–0.2)

## 2023-06-19 LAB — IRON AND IRON BINDING CAPACITY (CC-WL,HP ONLY)
Iron: 37 ug/dL (ref 28–170)
Saturation Ratios: 10 % — ABNORMAL LOW (ref 10.4–31.8)
TIBC: 360 ug/dL (ref 250–450)
UIBC: 323 ug/dL (ref 148–442)

## 2023-06-19 LAB — FERRITIN: Ferritin: 32 ng/mL (ref 11–307)

## 2023-06-26 ENCOUNTER — Encounter: Payer: Self-pay | Admitting: Nurse Practitioner

## 2023-06-26 NOTE — Assessment & Plan Note (Addendum)
 Referred from primary care after annual wellness visit on 04/14/2023.  She had complaints of fatigue.  CBC on 04/14/2023 noted hemoglobin of 5.5, hematocrit 20.4 MCV 69, platelets 294 iron  9, iron  saturation 2, ferritin 7, vitamin B12 523, folic acid  4.7, and TIBC 403.  Her metabolic panel showed normal kidney functions and liver functions.  TSH also normal.  She has been treated for iron  deficiency anemia at the [facility] cancer center in the past for iron  deficiency anemia.  She has been treated with IV iron .  Her hemoglobin levels did improve slightly, however she never noticed any improvement in symptomology.  She has not been evaluated for anemia, other than primary care, since 2022. She continues to have monthly menstrual cycles. She states that as she gets a little older, they are becoming lighter.  She denies blood in her stool, hematuria, or hematemesis.  She has not had GI workup, but is willing to have referral to GI if IV iron  not effective. -06/18/2023 - blood counts improved after initial 5 treatments with IV Venofer , though still anemic with Hgb 9.6, HCT 31.8, iron  37, saturation at 10%.  Ferritin levels are pending at time of this visit. --Will likely treat with additional 5 treatments of IV Venofer .  Plan to treat if ferritin <50.  Will see her back in 2 months with additional lab check at that time. -Refer to GI for workup if iron  studies begin to fall again.  She is agreeable to this plan.

## 2023-06-29 ENCOUNTER — Telehealth: Payer: Self-pay | Admitting: Pharmacy Technician

## 2023-06-29 NOTE — Telephone Encounter (Signed)
 Auth Submission: NO AUTH NEEDED Site of care: Site of care: [facility] Payer: [employer] Medication & CPT/J Code(s) submitted: Venofer (Iron Sucrose) J1756 Route of submission (phone, fax, portal):  Phone # Fax # Auth type: Buy/Bill PB Units/visits requested: 5 DOSES Reference number:  Approval from: 06/29/23 to 10/29/23

## 2023-07-06 ENCOUNTER — Ambulatory Visit (INDEPENDENT_AMBULATORY_CARE_PROVIDER_SITE_OTHER)

## 2023-07-06 VITALS — BP 107/58 | HR 76 | Temp 97.6°F | Resp 16 | Ht 66.0 in | Wt 195.2 lb

## 2023-07-06 DIAGNOSIS — D500 Iron deficiency anemia secondary to blood loss (chronic): Secondary | ICD-10-CM | POA: Diagnosis not present

## 2023-07-06 DIAGNOSIS — N920 Excessive and frequent menstruation with regular cycle: Secondary | ICD-10-CM | POA: Diagnosis not present

## 2023-07-06 MED ORDER — DIPHENHYDRAMINE HCL 25 MG PO CAPS
25.0000 mg | ORAL_CAPSULE | Freq: Once | ORAL | Status: AC
  Administered 2023-07-06: 25 mg via ORAL
  Filled 2023-07-06: qty 1

## 2023-07-06 MED ORDER — SODIUM CHLORIDE 0.9 % IV BOLUS
250.0000 mL | Freq: Once | INTRAVENOUS | Status: DC
  Filled 2023-07-06: qty 250

## 2023-07-06 MED ORDER — IRON SUCROSE 20 MG/ML IV SOLN
200.0000 mg | Freq: Once | INTRAVENOUS | Status: AC
  Administered 2023-07-06: 200 mg via INTRAVENOUS
  Filled 2023-07-06: qty 10

## 2023-07-06 MED ORDER — ACETAMINOPHEN 325 MG PO TABS
650.0000 mg | ORAL_TABLET | Freq: Once | ORAL | Status: AC
  Administered 2023-07-06: 650 mg via ORAL
  Filled 2023-07-06: qty 2

## 2023-07-06 NOTE — Progress Notes (Signed)
 Diagnosis: Iron Deficiency Anemia  Provider:  Chilton Greathouse MD  Procedure: IV Push  IV Type: Peripheral, IV Location: R Forearm  Venofer (Iron Sucrose), Dose: 200 mg  Post Infusion IV Care: Observation period completed and Peripheral IV Discontinued  Discharge: Condition: Good, Destination: Home . AVS Provided  Performed by:  Garnette Czech, RN

## 2023-07-13 ENCOUNTER — Ambulatory Visit (INDEPENDENT_AMBULATORY_CARE_PROVIDER_SITE_OTHER)

## 2023-07-13 VITALS — BP 109/75 | HR 66 | Temp 97.7°F | Resp 12 | Ht 66.0 in | Wt 196.2 lb

## 2023-07-13 DIAGNOSIS — N920 Excessive and frequent menstruation with regular cycle: Secondary | ICD-10-CM | POA: Diagnosis not present

## 2023-07-13 DIAGNOSIS — D500 Iron deficiency anemia secondary to blood loss (chronic): Secondary | ICD-10-CM | POA: Diagnosis not present

## 2023-07-13 MED ORDER — DIPHENHYDRAMINE HCL 25 MG PO CAPS
25.0000 mg | ORAL_CAPSULE | Freq: Once | ORAL | Status: AC
  Administered 2023-07-13: 25 mg via ORAL
  Filled 2023-07-13: qty 1

## 2023-07-13 MED ORDER — ACETAMINOPHEN 325 MG PO TABS
650.0000 mg | ORAL_TABLET | Freq: Once | ORAL | Status: AC
  Administered 2023-07-13: 650 mg via ORAL
  Filled 2023-07-13: qty 2

## 2023-07-13 MED ORDER — IRON SUCROSE 20 MG/ML IV SOLN
200.0000 mg | Freq: Once | INTRAVENOUS | Status: AC
  Administered 2023-07-13: 200 mg via INTRAVENOUS
  Filled 2023-07-13: qty 10

## 2023-07-13 NOTE — Progress Notes (Signed)
 Diagnosis: Iron  Deficiency Anemia  Provider:  [name] [name] MD  Procedure: IV Push  IV Type: Peripheral, IV Location: R Hand  Venofer  (Iron  Sucrose), Dose: 200 mg  Post Infusion IV Care: Patient declined observation PIV Discontinued  Discharge: Condition: Good, Destination: Home . AVS Declined  Performed by:  Natividad Balding, RN

## 2023-07-20 ENCOUNTER — Ambulatory Visit

## 2023-07-20 MED ORDER — DIPHENHYDRAMINE HCL 25 MG PO CAPS
25.0000 mg | ORAL_CAPSULE | Freq: Once | ORAL | Status: DC
Start: 2023-07-20 — End: 2023-07-20

## 2023-07-20 MED ORDER — IRON SUCROSE 20 MG/ML IV SOLN: 200.0000 mg | Freq: Once | INTRAVENOUS | Status: DC

## 2023-07-20 MED ORDER — ACETAMINOPHEN 325 MG PO TABS: 650.0000 mg | ORAL_TABLET | Freq: Once | ORAL | Status: DC

## 2023-07-23 ENCOUNTER — Ambulatory Visit

## 2023-07-23 MED ORDER — IRON SUCROSE 20 MG/ML IV SOLN: 200.0000 mg | Freq: Once | INTRAVENOUS | Status: DC

## 2023-07-23 MED ORDER — ACETAMINOPHEN 325 MG PO TABS: 650.0000 mg | ORAL_TABLET | Freq: Once | ORAL | Status: DC

## 2023-07-23 MED ORDER — DIPHENHYDRAMINE HCL 25 MG PO CAPS: 25.0000 mg | ORAL_CAPSULE | Freq: Once | ORAL | Status: DC

## 2023-07-24 ENCOUNTER — Encounter: Payer: Self-pay | Admitting: Nurse Practitioner

## 2023-07-27 ENCOUNTER — Ambulatory Visit

## 2023-07-27 VITALS — BP 114/70 | HR 63 | Temp 98.2°F | Resp 16 | Ht 66.0 in | Wt 193.4 lb

## 2023-07-27 DIAGNOSIS — D500 Iron deficiency anemia secondary to blood loss (chronic): Secondary | ICD-10-CM | POA: Diagnosis not present

## 2023-07-27 DIAGNOSIS — N920 Excessive and frequent menstruation with regular cycle: Secondary | ICD-10-CM

## 2023-07-27 MED ORDER — ACETAMINOPHEN 325 MG PO TABS
650.0000 mg | ORAL_TABLET | Freq: Once | ORAL | Status: AC
  Administered 2023-07-27: 650 mg via ORAL
  Filled 2023-07-27: qty 2

## 2023-07-27 MED ORDER — DIPHENHYDRAMINE HCL 25 MG PO CAPS
25.0000 mg | ORAL_CAPSULE | Freq: Once | ORAL | Status: AC
Start: 2023-07-27 — End: 2023-07-27
  Administered 2023-07-27: 25 mg via ORAL
  Filled 2023-07-27: qty 1

## 2023-07-27 MED ORDER — IRON SUCROSE 20 MG/ML IV SOLN
200.0000 mg | Freq: Once | INTRAVENOUS | Status: AC
  Administered 2023-07-27: 200 mg via INTRAVENOUS
  Filled 2023-07-27: qty 10

## 2023-07-27 NOTE — Progress Notes (Signed)
 Diagnosis: Iron Deficiency Anemia  Provider:  Chilton Greathouse MD  Procedure: IV Push  IV Type: Peripheral, IV Location: R Antecubital  Venofer (Iron Sucrose), Dose: 200 mg  Post Infusion IV Care: Patient declined observation and Peripheral IV Discontinued  Discharge: Condition: Good, Destination: Home . AVS Declined  Performed by:  Rico Ala, LPN

## 2023-07-30 ENCOUNTER — Ambulatory Visit

## 2023-07-30 MED ORDER — ALBUTEROL SULFATE HFA 108 (90 BASE) MCG/ACT IN AERS
2.0000 | INHALATION_SPRAY | Freq: Once | RESPIRATORY_TRACT | Status: AC | PRN
Start: 2023-07-30 — End: ?

## 2023-07-30 MED ORDER — SODIUM CHLORIDE 0.9 % IV SOLN: Freq: Once | INTRAVENOUS | Status: AC | PRN

## 2023-07-30 MED ORDER — EPINEPHRINE 0.3 MG/0.3ML IJ SOAJ: 0.3000 mg | Freq: Once | INTRAMUSCULAR | Status: AC | PRN

## 2023-07-30 MED ORDER — FAMOTIDINE IN NACL 20-0.9 MG/50ML-% IV SOLN: 20.0000 mg | Freq: Once | INTRAVENOUS | Status: AC | PRN

## 2023-07-30 MED ORDER — IRON SUCROSE 20 MG/ML IV SOLN: 200.0000 mg | Freq: Once | INTRAVENOUS | Status: AC

## 2023-07-30 MED ORDER — METHYLPREDNISOLONE SODIUM SUCC 125 MG IJ SOLR
125.0000 mg | Freq: Once | INTRAMUSCULAR | Status: AC | PRN
Start: 2023-07-30 — End: ?

## 2023-07-30 MED ORDER — DIPHENHYDRAMINE HCL 50 MG/ML IJ SOLN
50.0000 mg | Freq: Once | INTRAMUSCULAR | Status: AC | PRN
Start: 2023-07-30 — End: ?

## 2023-08-20 ENCOUNTER — Other Ambulatory Visit: Payer: Self-pay

## 2023-08-20 DIAGNOSIS — D500 Iron deficiency anemia secondary to blood loss (chronic): Secondary | ICD-10-CM

## 2023-08-20 NOTE — Progress Notes (Deleted)
 Patient Care Team: [name], [name], MD as PCP - General (Pulmonary Disease) Merriam Abbey, DO as Consulting Physician (Neurology)  Clinic Day:  08/20/2023  Referring physician: [name], [name], MD  ASSESSMENT & PLAN:   Assessment & Plan: Iron  deficiency anemia due to chronic blood loss Referred from primary care after annual wellness visit on 04/14/2023.  She had complaints of fatigue.  CBC on 04/14/2023 noted hemoglobin of 5.5, hematocrit 20.4 MCV 69, platelets 294 iron  9, iron  saturation 2, ferritin 7, vitamin B12 523, folic acid  4.7, and TIBC 403.  Her metabolic panel showed normal kidney functions and liver functions.  TSH also normal.  She has been treated for iron  deficiency anemia at the [facility] cancer center in the past for iron  deficiency anemia.  She has been treated with IV iron .  Her hemoglobin levels did improve slightly, however she never noticed any improvement in symptomology.  She has not been evaluated for anemia, other than primary care, since 2022. She continues to have monthly menstrual cycles. She states that as she gets a little older, they are becoming lighter.  She denies blood in her stool, hematuria, or hematemesis.  She has not had GI workup, but is willing to have referral to GI if IV iron  not effective. -06/18/2023 - blood counts improved after initial 5 treatments with IV Venofer , though still anemic with Hgb 9.6, HCT 31.8, iron  37, saturation at 10%.  Ferritin levels are pending at time of this visit. --Will likely treat with additional 5 treatments of IV Venofer .  Plan to treat if ferritin <50.  Will see her back in 2 months with additional lab check at that time. -Refer to GI for workup if iron  studies begin to fall again.  She is agreeable to this plan.    The patient understands the plans discussed today and is in agreement with them.  She knows to contact our office if she develops concerns prior to her next appointment.  I provided *** minutes of face-to-face time  during this encounter and > 50% was spent counseling as documented under my assessment and plan.    Taylor Deis, NP  [facility] - A DEPT OF [facility] [address] [place] [address] Dept: [phone] Dept Fax: [phone]   No orders of the defined types were placed in this encounter.     CHIEF COMPLAINT:  CC: iron  deficiency anemia due to chronic blood loss   Current Treatment:  IV Venofer  for ferritin < 50  INTERVAL HISTORY:  Taylor Duran is here today for repeat clinical assessment. She was last seen by me 06/19/2023. Her last reatment with IV Venofer  was 07/30/2023.  She denies fevers or chills. She denies pain. Her appetite is good. Her weight {Weight change:10426}.  I have reviewed the past medical history, past surgical history, social history and family history with the patient and they are unchanged from previous note.  ALLERGIES:  is allergic to shellfish allergy and penicillins.  MEDICATIONS:  No current outpatient medications on file.   No current facility-administered medications for this visit.   Facility-Administered Medications Ordered in Other Visits  Medication Dose Route Frequency Provider Last Rate Last Admin   0.9 %  sodium chloride  infusion   Intravenous Once PRN Majestic Molony E, NP       albuterol  (VENTOLIN  HFA) 108 (90 Base) MCG/ACT inhaler 2 puff  2 puff Inhalation Once PRN Kenyata Guess E, NP  diphenhydrAMINE  (BENADRYL ) injection 50 mg  50 mg Intravenous Once PRN Vonna Brabson E, NP       EPINEPHrine  (EPI-PEN) injection 0.3 mg  0.3 mg Intramuscular Once PRN Mort Smelser E, NP       famotidine  (PEPCID ) IVPB 20 mg premix  20 mg Intravenous Once PRN Johnika Escareno E, NP       iron  sucrose (VENOFER ) injection 200 mg  200 mg Intravenous Once Ransome Helwig E, NP       methylPREDNISolone  sodium succinate (SOLU-MEDROL ) 125 mg/2 mL injection 125 mg  125 mg Intravenous Once  PRN Eesa Justiss E, NP        HISTORY OF PRESENT ILLNESS:   Oncology History   No history exists.      REVIEW OF SYSTEMS:   Constitutional: Denies fevers, chills or abnormal weight loss Eyes: Denies blurriness of vision Ears, nose, mouth, throat, and face: Denies mucositis or sore throat Respiratory: Denies cough, dyspnea or wheezes Cardiovascular: Denies palpitation, chest discomfort or lower extremity swelling Gastrointestinal:  Denies nausea, heartburn or change in bowel habits Skin: Denies abnormal skin rashes Lymphatics: Denies new lymphadenopathy or easy bruising Neurological:Denies numbness, tingling or new weaknesses Behavioral/Psych: Mood is stable, no new changes  All other systems were reviewed with the patient and are negative.   VITALS:  There were no vitals taken for this visit.  Wt Readings from Last 3 Encounters:  07/27/23 193 lb 6.4 oz (87.7 kg)  07/13/23 196 lb 3.2 oz (89 kg)  07/06/23 195 lb 3.2 oz (88.5 kg)    There is no height or weight on file to calculate BMI.  Performance status (ECOG): {CHL ONC [id number]  PHYSICAL EXAM:   GENERAL:alert, no distress and comfortable SKIN: skin color, texture, turgor are normal, no rashes or significant lesions EYES: normal, Conjunctiva are pink and non-injected, sclera clear OROPHARYNX:no exudate, no erythema and lips, buccal mucosa, and tongue normal  NECK: supple, thyroid normal size, non-tender, without nodularity LYMPH:  no palpable lymphadenopathy in the cervical, axillary or inguinal LUNGS: clear to auscultation and percussion with normal breathing effort HEART: regular rate & rhythm and no murmurs and no lower extremity edema ABDOMEN:abdomen soft, non-tender and normal bowel sounds Musculoskeletal:no cyanosis of digits and no clubbing  NEURO: alert & oriented x 3 with fluent speech, no focal motor/sensory deficits  LABORATORY DATA:  I have reviewed the data as listed    Component Value  Date/Time   NA 136 01/24/2022 1300   NA 140 01/21/2017 0839   NA 139 12/30/2011 1400   K 3.1 (L) 01/24/2022 1300   K 3.5 12/30/2011 1400   CL 104 01/24/2022 1300   CL 107 12/30/2011 1400   CO2 25 01/24/2022 1300   CO2 22 12/30/2011 1400   GLUCOSE 97 01/24/2022 1300   GLUCOSE 76 12/30/2011 1400   BUN <5 (L) 01/24/2022 1300   BUN 6 01/21/2017 0839   BUN 4 (L) 12/30/2011 1400   CREATININE 0.52 01/24/2022 1300   CREATININE 0.48 (L) 12/30/2011 1400   CALCIUM 8.4 (L) 01/24/2022 1300   CALCIUM 8.2 (L) 12/30/2011 1400   PROT 7.0 01/21/2017 0839   ALBUMIN 4.1 01/21/2017 0839   ALBUMIN 4.6 01/15/2017 1330   AST 13 01/21/2017 0839   ALT 8 01/21/2017 0839   ALKPHOS 81 01/21/2017 0839   BILITOT 0.3 01/21/2017 0839   GFRNONAA >60 01/24/2022 1300   GFRNONAA >60 12/30/2011 1400   GFRAA 136 01/21/2017 0839   GFRAA >60 12/30/2011 1400  No results found for: SPEP, UPEP  Lab Results  Component Value Date   WBC 3.9 (L) 06/19/2023   NEUTROABS 2.2 06/19/2023   HGB 9.6 (L) 06/19/2023   HCT 31.8 (L) 06/19/2023   MCV 81.1 06/19/2023   PLT 283 06/19/2023      Chemistry      Component Value Date/Time   NA 136 01/24/2022 1300   NA 140 01/21/2017 0839   NA 139 12/30/2011 1400   K 3.1 (L) 01/24/2022 1300   K 3.5 12/30/2011 1400   CL 104 01/24/2022 1300   CL 107 12/30/2011 1400   CO2 25 01/24/2022 1300   CO2 22 12/30/2011 1400   BUN <5 (L) 01/24/2022 1300   BUN 6 01/21/2017 0839   BUN 4 (L) 12/30/2011 1400   CREATININE 0.52 01/24/2022 1300   CREATININE 0.48 (L) 12/30/2011 1400      Component Value Date/Time   CALCIUM 8.4 (L) 01/24/2022 1300   CALCIUM 8.2 (L) 12/30/2011 1400   ALKPHOS 81 01/21/2017 0839   AST 13 01/21/2017 0839   ALT 8 01/21/2017 0839   BILITOT 0.3 01/21/2017 0839       RADIOGRAPHIC STUDIES: I have personally reviewed the radiological images as listed and agreed with the findings in the report. No results found.

## 2023-08-20 NOTE — Assessment & Plan Note (Deleted)
 Referred from primary care after annual wellness visit on 04/14/2023.  She had complaints of fatigue.  CBC on 04/14/2023 noted hemoglobin of 5.5, hematocrit 20.4 MCV 69, platelets 294 iron  9, iron  saturation 2, ferritin 7, vitamin B12 523, folic acid  4.7, and TIBC 403.  Her metabolic panel showed normal kidney functions and liver functions.  TSH also normal.  She has been treated for iron  deficiency anemia at the [facility] cancer center in the past for iron  deficiency anemia.  She has been treated with IV iron .  Her hemoglobin levels did improve slightly, however she never noticed any improvement in symptomology.  She has not been evaluated for anemia, other than primary care, since 2022. She continues to have monthly menstrual cycles. She states that as she gets a little older, they are becoming lighter.  She denies blood in her stool, hematuria, or hematemesis.  She has not had GI workup, but is willing to have referral to GI if IV iron  not effective. -06/18/2023 - blood counts improved after initial 5 treatments with IV Venofer , though still anemic with Hgb 9.6, HCT 31.8, iron  37, saturation at 10%.  Ferritin levels are pending at time of this visit. --Will likely treat with additional 5 treatments of IV Venofer .  Plan to treat if ferritin <50.  Will see her back in 2 months with additional lab check at that time. -Refer to GI for workup if iron  studies begin to fall again.  She is agreeable to this plan.

## 2023-08-21 ENCOUNTER — Inpatient Hospital Stay: Admitting: Nurse Practitioner

## 2023-08-21 ENCOUNTER — Inpatient Hospital Stay

## 2023-08-21 DIAGNOSIS — D500 Iron deficiency anemia secondary to blood loss (chronic): Secondary | ICD-10-CM

## 2023-08-27 NOTE — Progress Notes (Deleted)
 Patient Care Team: [name], [name], MD as PCP - General (Pulmonary Disease) Merriam Abbey, DO as Consulting Physician (Neurology)  Clinic Day:  08/27/2023  Referring physician: [name], [name], MD  ASSESSMENT & PLAN:   Assessment & Plan: Iron  deficiency anemia due to chronic blood loss Referred from primary care after annual wellness visit on 04/14/2023.  She had complaints of fatigue.  CBC on 04/14/2023 noted hemoglobin of 5.5, hematocrit 20.4 MCV 69, platelets 294 iron  9, iron  saturation 2, ferritin 7, vitamin B12 523, folic acid  4.7, and TIBC 403.  Her metabolic panel showed normal kidney functions and liver functions.  TSH also normal.  She has been treated for iron  deficiency anemia at the [facility] center in the past for iron  deficiency anemia.  She has been treated with IV iron .  Her hemoglobin levels did improve slightly, however she never noticed any improvement in symptomology.  She has not been evaluated for anemia, other than primary care, since 2022. She continues to have monthly menstrual cycles. She states that as she gets a little older, they are becoming lighter.  She denies blood in her stool, hematuria, or hematemesis.  She has not had GI workup, but is willing to have referral to GI if IV iron  not effective. -06/18/2023 - blood counts improved after initial 5 treatments with IV Venofer , though still anemic with Hgb 9.6, HCT 31.8, iron  37, saturation at 10%.  Ferritin levels are pending at time of this visit. --Will likely treat with additional 5 treatments of IV Venofer .  Plan to treat if ferritin <50.  Will see her back in 2 months with additional lab check at that time. -Refer to GI for workup if iron  studies begin to fall again.  She is agreeable to this plan. - She did receive second round of IV Venofer  (1000 mg).  Last IV iron  treatment was 07/30/2023.    The patient understands the plans discussed today and is in agreement with them.  She knows to contact our office if she  develops concerns prior to her next appointment.  I provided *** minutes of face-to-face time during this encounter and > 50% was spent counseling as documented under my assessment and plan.    Sharyon Deis, NP  [facility] - A DEPT OF [facility]  [facility] [address] [place] [address] Dept: [phone] Dept Fax: [phone]   No orders of the defined types were placed in this encounter.     CHIEF COMPLAINT:  CC: Iron  deficiency anemia due to chronic blood loss  Current Treatment: IV iron  as needed  INTERVAL HISTORY:  Taylor Duran is here today for repeat clinical assessment.  Visit was with me on 06/19/2023.  Her labs had improved however she was still significantly iron  deficient.  Ferritin was 32.  Her Hgb was 9.6, HCT 31.8, and platelets 283.  Her last iron  infusion was 07/30/2023.  She denies fevers or chills. She denies pain. Her appetite is good. Her weight {Weight change:10426}.  I have reviewed the past medical history, past surgical history, social history and family history with the patient and they are unchanged from previous note.  ALLERGIES:  is allergic to shellfish allergy and penicillins.  MEDICATIONS:  No current outpatient medications on file.   No current facility-administered medications for this visit.   Facility-Administered Medications Ordered in Other Visits  Medication Dose Route Frequency Provider Last Rate Last Admin   0.9 %  sodium  chloride infusion   Intravenous Once PRN Margaretann Abate E, NP       albuterol  (VENTOLIN  HFA) 108 (90 Base) MCG/ACT inhaler 2 puff  2 puff Inhalation Once PRN Tamber Burtch E, NP       diphenhydrAMINE  (BENADRYL ) injection 50 mg  50 mg Intravenous Once PRN Codee Tutson E, NP       EPINEPHrine  (EPI-PEN) injection 0.3 mg  0.3 mg Intramuscular Once PRN Aleksander Edmiston E, NP       famotidine  (PEPCID ) IVPB 20 mg premix  20 mg Intravenous Once PRN Jaren Kearn  E, NP       iron  sucrose (VENOFER ) injection 200 mg  200 mg Intravenous Once Georga Stys E, NP       methylPREDNISolone  sodium succinate (SOLU-MEDROL ) 125 mg/2 mL injection 125 mg  125 mg Intravenous Once PRN Hennesy Sobalvarro E, NP        HISTORY OF PRESENT ILLNESS:   Oncology History   No history exists.      REVIEW OF SYSTEMS:   Constitutional: Denies fevers, chills or abnormal weight loss Eyes: Denies blurriness of vision Ears, nose, mouth, throat, and face: Denies mucositis or sore throat Respiratory: Denies cough, dyspnea or wheezes Cardiovascular: Denies palpitation, chest discomfort or lower extremity swelling Gastrointestinal:  Denies nausea, heartburn or change in bowel habits Skin: Denies abnormal skin rashes Lymphatics: Denies new lymphadenopathy or easy bruising Neurological:Denies numbness, tingling or new weaknesses Behavioral/Psych: Mood is stable, no new changes  All other systems were reviewed with the patient and are negative.   VITALS:  There were no vitals taken for this visit.  Wt Readings from Last 3 Encounters:  07/27/23 193 lb 6.4 oz (87.7 kg)  07/13/23 196 lb 3.2 oz (89 kg)  07/06/23 195 lb 3.2 oz (88.5 kg)    There is no height or weight on file to calculate BMI.  Performance status (ECOG): {CHL ONC [id number]  PHYSICAL EXAM:   GENERAL:alert, no distress and comfortable SKIN: skin color, texture, turgor are normal, no rashes or significant lesions EYES: normal, Conjunctiva are pink and non-injected, sclera clear OROPHARYNX:no exudate, no erythema and lips, buccal mucosa, and tongue normal  NECK: supple, thyroid normal size, non-tender, without nodularity LYMPH:  no palpable lymphadenopathy in the cervical, axillary or inguinal LUNGS: clear to auscultation and percussion with normal breathing effort HEART: regular rate & rhythm and no murmurs and no lower extremity edema ABDOMEN:abdomen soft, non-tender and normal bowel  sounds Musculoskeletal:no cyanosis of digits and no clubbing  NEURO: alert & oriented x 3 with fluent speech, no focal motor/sensory deficits  LABORATORY DATA:  I have reviewed the data as listed    Component Value Date/Time   NA 136 01/24/2022 1300   NA 140 01/21/2017 0839   NA 139 12/30/2011 1400   K 3.1 (L) 01/24/2022 1300   K 3.5 12/30/2011 1400   CL 104 01/24/2022 1300   CL 107 12/30/2011 1400   CO2 25 01/24/2022 1300   CO2 22 12/30/2011 1400   GLUCOSE 97 01/24/2022 1300   GLUCOSE 76 12/30/2011 1400   BUN <5 (L) 01/24/2022 1300   BUN 6 01/21/2017 0839   BUN 4 (L) 12/30/2011 1400   CREATININE 0.52 01/24/2022 1300   CREATININE 0.48 (L) 12/30/2011 1400   CALCIUM 8.4 (L) 01/24/2022 1300   CALCIUM 8.2 (L) 12/30/2011 1400   PROT 7.0 01/21/2017 0839   ALBUMIN 4.1 01/21/2017 0839   ALBUMIN 4.6 01/15/2017 1330   AST 13 01/21/2017 0839  ALT 8 01/21/2017 0839   ALKPHOS 81 01/21/2017 0839   BILITOT 0.3 01/21/2017 0839   GFRNONAA >60 01/24/2022 1300   GFRNONAA >60 12/30/2011 1400   GFRAA 136 01/21/2017 0839   GFRAA >60 12/30/2011 1400    No results found for: SPEP, UPEP  Lab Results  Component Value Date   WBC 3.9 (L) 06/19/2023   NEUTROABS 2.2 06/19/2023   HGB 9.6 (L) 06/19/2023   HCT 31.8 (L) 06/19/2023   MCV 81.1 06/19/2023   PLT 283 06/19/2023      Chemistry      Component Value Date/Time   NA 136 01/24/2022 1300   NA 140 01/21/2017 0839   NA 139 12/30/2011 1400   K 3.1 (L) 01/24/2022 1300   K 3.5 12/30/2011 1400   CL 104 01/24/2022 1300   CL 107 12/30/2011 1400   CO2 25 01/24/2022 1300   CO2 22 12/30/2011 1400   BUN <5 (L) 01/24/2022 1300   BUN 6 01/21/2017 0839   BUN 4 (L) 12/30/2011 1400   CREATININE 0.52 01/24/2022 1300   CREATININE 0.48 (L) 12/30/2011 1400      Component Value Date/Time   CALCIUM 8.4 (L) 01/24/2022 1300   CALCIUM 8.2 (L) 12/30/2011 1400   ALKPHOS 81 01/21/2017 0839   AST 13 01/21/2017 0839   ALT 8 01/21/2017 0839    BILITOT 0.3 01/21/2017 0839       RADIOGRAPHIC STUDIES: I have personally reviewed the radiological images as listed and agreed with the findings in the report. No results found.

## 2023-08-27 NOTE — Assessment & Plan Note (Deleted)
 Referred from primary care after annual wellness visit on 04/14/2023.  She had complaints of fatigue.  CBC on 04/14/2023 noted hemoglobin of 5.5, hematocrit 20.4 MCV 69, platelets 294 iron  9, iron  saturation 2, ferritin 7, vitamin B12 523, folic acid  4.7, and TIBC 403.  Her metabolic panel showed normal kidney functions and liver functions.  TSH also normal.  She has been treated for iron  deficiency anemia at the [facility] cancer center in the past for iron  deficiency anemia.  She has been treated with IV iron .  Her hemoglobin levels did improve slightly, however she never noticed any improvement in symptomology.  She has not been evaluated for anemia, other than primary care, since 2022. She continues to have monthly menstrual cycles. She states that as she gets a little older, they are becoming lighter.  She denies blood in her stool, hematuria, or hematemesis.  She has not had GI workup, but is willing to have referral to GI if IV iron  not effective. -06/18/2023 - blood counts improved after initial 5 treatments with IV Venofer , though still anemic with Hgb 9.6, HCT 31.8, iron  37, saturation at 10%.  Ferritin levels are pending at time of this visit. --Will likely treat with additional 5 treatments of IV Venofer .  Plan to treat if ferritin <50.  Will see her back in 2 months with additional lab check at that time. -Refer to GI for workup if iron  studies begin to fall again.  She is agreeable to this plan. - She did receive second round of IV Venofer  (1000 mg).  Last IV iron  treatment was 07/30/2023.

## 2023-08-28 ENCOUNTER — Inpatient Hospital Stay: Admitting: Nurse Practitioner

## 2023-08-28 ENCOUNTER — Inpatient Hospital Stay: Attending: Nurse Practitioner

## 2023-08-28 DIAGNOSIS — D500 Iron deficiency anemia secondary to blood loss (chronic): Secondary | ICD-10-CM
